# Patient Record
Sex: Female | Born: 1955 | Race: White | Hispanic: No | Marital: Married | Smoking: Former smoker
Health system: Southern US, Community
[De-identification: ages and names within clinical notes are randomized; demographics above are authoritative.]

## PROBLEM LIST (undated history)

## (undated) DIAGNOSIS — Z973 Presence of spectacles and contact lenses: Secondary | ICD-10-CM

## (undated) DIAGNOSIS — R011 Cardiac murmur, unspecified: Secondary | ICD-10-CM

## (undated) DIAGNOSIS — N6019 Diffuse cystic mastopathy of unspecified breast: Secondary | ICD-10-CM

## (undated) DIAGNOSIS — L900 Lichen sclerosus et atrophicus: Secondary | ICD-10-CM

## (undated) DIAGNOSIS — Z803 Family history of malignant neoplasm of breast: Secondary | ICD-10-CM

## (undated) DIAGNOSIS — I10 Essential (primary) hypertension: Secondary | ICD-10-CM

## (undated) HISTORY — PX: BREAST EXCISIONAL BIOPSY: SUR124

## (undated) HISTORY — DX: Family history of malignant neoplasm of breast: Z80.3

## (undated) HISTORY — DX: Diffuse cystic mastopathy of unspecified breast: N60.19

## (undated) HISTORY — PX: BREAST BIOPSY: SHX20

## (undated) HISTORY — PX: ABDOMINAL HYSTERECTOMY: SHX81

## (undated) HISTORY — DX: Essential (primary) hypertension: I10

## (undated) HISTORY — PX: BREAST CYST ASPIRATION: SHX578

---

## 2014-08-28 ENCOUNTER — Other Ambulatory Visit: Payer: Self-pay | Admitting: Obstetrics & Gynecology

## 2014-08-28 DIAGNOSIS — N6012 Diffuse cystic mastopathy of left breast: Secondary | ICD-10-CM

## 2014-08-28 DIAGNOSIS — Z803 Family history of malignant neoplasm of breast: Secondary | ICD-10-CM

## 2014-08-28 DIAGNOSIS — N6011 Diffuse cystic mastopathy of right breast: Secondary | ICD-10-CM

## 2014-09-04 ENCOUNTER — Ambulatory Visit
Admission: RE | Admit: 2014-09-04 | Discharge: 2014-09-04 | Disposition: A | Discharge status: Home / Self Care | Payer: 59 | Source: Ambulatory Visit | Attending: Obstetrics & Gynecology | Admitting: Obstetrics & Gynecology

## 2014-09-04 ENCOUNTER — Ambulatory Visit: Payer: Self-pay

## 2014-09-04 DIAGNOSIS — N63 Unspecified lump in breast: Secondary | ICD-10-CM | POA: Insufficient documentation

## 2014-09-04 DIAGNOSIS — N6011 Diffuse cystic mastopathy of right breast: Secondary | ICD-10-CM

## 2014-09-04 DIAGNOSIS — N6012 Diffuse cystic mastopathy of left breast: Secondary | ICD-10-CM

## 2014-09-04 DIAGNOSIS — Z803 Family history of malignant neoplasm of breast: Secondary | ICD-10-CM

## 2014-11-25 ENCOUNTER — Ambulatory Visit (INDEPENDENT_AMBULATORY_CARE_PROVIDER_SITE_OTHER): Payer: 59 | Admitting: Family Medicine

## 2014-11-25 ENCOUNTER — Encounter: Payer: Self-pay | Admitting: Family Medicine

## 2014-11-25 VITALS — BP 140/88 | HR 76 | Ht 66.0 in | Wt 165.2 lb

## 2014-11-25 DIAGNOSIS — I10 Essential (primary) hypertension: Secondary | ICD-10-CM | POA: Diagnosis not present

## 2014-11-25 DIAGNOSIS — E559 Vitamin D deficiency, unspecified: Secondary | ICD-10-CM | POA: Diagnosis not present

## 2014-11-25 DIAGNOSIS — S39012A Strain of muscle, fascia and tendon of lower back, initial encounter: Secondary | ICD-10-CM

## 2014-11-25 MED ORDER — CYCLOBENZAPRINE HCL 10 MG PO TABS: 10.0000 mg | ORAL_TABLET | Freq: Every evening | ORAL | Status: DC | PRN

## 2014-11-26 ENCOUNTER — Encounter: Payer: Self-pay | Admitting: Family Medicine

## 2014-11-26 DIAGNOSIS — I10 Essential (primary) hypertension: Secondary | ICD-10-CM | POA: Insufficient documentation

## 2014-11-26 NOTE — Progress Notes (Signed)
Date:  11/25/2014   Name:  Ebony Harris   DOB:  Jun 24, 1955   MRN:  [id number]  PCP:  Schuyler Amor, MD    Chief Complaint: Back Pain   History of Present Illness:  This is a 59 y.o. female who injured back 9 days ago skiing, felt pop R lower back while trying to get up on one ski, pain has slowly improved but still causing trouble sleeping, no significant radiation up back or down leg, no numbness/weakness distally, pain better with walking. Taking BP med for HTN, lipids ok 07/2013, has stopped vit D supplement as getting adequate sun exposure.  Review of Systems:  Review of Systems  Patient Active Problem List   Diagnosis Date Noted  . Hypertension 11/26/2014    Prior to Admission medications   Medication Sig Start Date End Date Taking? Authorizing Provider  lisinopril-hydrochlorothiazide (PRINZIDE,ZESTORETIC) 20-12.5 MG per tablet Take 1 tablet by mouth daily at 2 PM daily at 2 PM. 09/17/14  Yes Historical Provider, MD  Multiple Vitamins-Minerals (MULTIVITAMIN WITH MINERALS) tablet Take 1 tablet by mouth daily.   Yes Historical Provider, MD  cyclobenzaprine (FLEXERIL) 10 MG tablet Take 1 tablet (10 mg total) by mouth at bedtime as needed for muscle spasms. 11/25/14   Schuyler Amor, MD    No Known Allergies  Past Surgical History  Procedure Laterality Date  . Breast biopsy Left     03/2014 negative  . Breast biopsy Left     1992 negative  . Breast cyst aspiration Left     negative  . Abdominal hysterectomy      partial    History  Substance Use Topics  . Smoking status: Never Smoker   . Smokeless tobacco: Not on file  . Alcohol Use: 0.0 oz/week    0 Standard drinks or equivalent per week    Family History  Problem Relation Age of Onset  . Breast cancer Daughter 86    Medication list has been reviewed and updated.  Physical Examination: BP 140/88 mmHg  Pulse 76  Ht  (1.676 m)  Wt 165 lb 3.2 oz (74.934 kg)  BMI 26.68 kg/m2  Physical Exam  Constitutional:  She appears well-developed and well-nourished. No distress.  Musculoskeletal:  Mildly tender R paralumbar region, negative B SLR     Assessment and Plan:  1. Lumbar strain, initial encounter Begin Flexeril qhs prn pain, may continue Aleve bid, call if sxs worsen/persist  2. Vitamin D deficiency Ok to stop supplementation with adequate sun exposure  3. Essential hypertension Adequate control, continue current regimen  Return in about 6 months (around 05/28/2015).  Dionne Ano. Kingsley Spittle MD [facility]  11/26/2014

## 2015-01-03 ENCOUNTER — Ambulatory Visit (INDEPENDENT_AMBULATORY_CARE_PROVIDER_SITE_OTHER): Payer: PRIVATE HEALTH INSURANCE | Admitting: Family Medicine

## 2015-01-03 ENCOUNTER — Encounter: Payer: Self-pay | Admitting: Family Medicine

## 2015-01-03 VITALS — BP 130/72 | HR 76 | Ht 66.0 in | Wt 162.4 lb

## 2015-01-03 DIAGNOSIS — S39012S Strain of muscle, fascia and tendon of lower back, sequela: Secondary | ICD-10-CM

## 2015-01-03 DIAGNOSIS — I10 Essential (primary) hypertension: Secondary | ICD-10-CM

## 2015-01-03 NOTE — Progress Notes (Signed)
Date:  01/03/2015   Name:  Ebony Harris   DOB:  07/06/1955   MRN:  [id number]  PCP:  Schuyler Amor, MD    Chief Complaint: Back Pain   History of Present Illness:  This is a 59 y.o. female s/p acute lumbar strain 6 wks ago, still having intermittent back pain/muscle spasms especially at night. Interested in exercises to help back. Has not been taking Flexeril.  Review of Systems:  Review of Systems  Musculoskeletal: Negative for gait problem.  Neurological: Negative for weakness and numbness.    Patient Active Problem List   Diagnosis Date Noted  . Hypertension 11/26/2014    Prior to Admission medications   Medication Sig Start Date End Date Taking? Authorizing Provider  cyclobenzaprine (FLEXERIL) 10 MG tablet Take 1 tablet (10 mg total) by mouth at bedtime as needed for muscle spasms. 11/25/14  Yes Schuyler Amor, MD  fluticasone (FLONASE) 50 MCG/ACT nasal spray Place into both nostrils daily.   Yes Historical Provider, MD  lisinopril-hydrochlorothiazide (PRINZIDE,ZESTORETIC) 20-12.5 MG per tablet Take 1 tablet by mouth daily at 2 PM daily at 2 PM. 09/17/14  Yes Historical Provider, MD  Multiple Vitamins-Minerals (MULTIVITAMIN WITH MINERALS) tablet Take 1 tablet by mouth daily.   Yes Historical Provider, MD    No Known Allergies  Past Surgical History  Procedure Laterality Date  . Breast biopsy Left     03/2014 negative  . Breast biopsy Left     1992 negative  . Breast cyst aspiration Left     negative  . Abdominal hysterectomy      partial    Social History  Substance Use Topics  . Smoking status: Never Smoker   . Smokeless tobacco: None  . Alcohol Use: 0.0 oz/week    0 Standard drinks or equivalent per week    Family History  Problem Relation Age of Onset  . Breast cancer Daughter 72    Medication list has been reviewed and updated.  Physical Examination: BP 130/72 mmHg  Pulse 76  Ht  (1.676 m)  Wt 162 lb 6.4 oz (73.664 kg)  BMI 26.22  kg/m2  Physical Exam  Constitutional: She appears well-developed and well-nourished.  Musculoskeletal: She exhibits no edema.  Negative B SLR No spinal or paralumbar tenderness    Assessment and Plan:  1. Lumbar strain, sequela Restart Flexeril qhs for two weeks - Ambulatory referral to Physical Therapy  2. HTN Well controlled on current regimen   Return if symptoms worsen or fail to improve.  Dionne Ano. Kingsley Spittle MD [facility]  01/03/2015

## 2015-01-08 ENCOUNTER — Encounter: Payer: Self-pay | Admitting: Physical Therapy

## 2015-01-08 ENCOUNTER — Ambulatory Visit: Payer: 59 | Attending: Family Medicine | Admitting: Physical Therapy

## 2015-01-08 DIAGNOSIS — M6289 Other specified disorders of muscle: Secondary | ICD-10-CM

## 2015-01-08 DIAGNOSIS — G729 Myopathy, unspecified: Secondary | ICD-10-CM | POA: Insufficient documentation

## 2015-01-08 DIAGNOSIS — M545 Low back pain, unspecified: Secondary | ICD-10-CM

## 2015-01-08 NOTE — Therapy (Signed)
[place] [facility] [address] [place], [place], [address] Phone: [phone]   Fax:  [phone]  Physical Therapy Evaluation  Patient Details  Name: Terianna Peggs MRN: [id number] Date of Birth: [birth year] Referring Provider:  Schuyler Amor, MD  Encounter Date: 01/08/2015      PT End of Session - 01/08/15 1234    Visit Number 1   Number of Visits 6   Date for PT Re-Evaluation 01/29/15   PT Start Time 0728   PT Stop Time 0819   PT Time Calculation (min) 51 min   Activity Tolerance Patient tolerated treatment well;No increased pain   Behavior During Therapy [facility] for tasks assessed/performed      History reviewed. No pertinent past medical history.  Past Surgical History  Procedure Laterality Date  . Breast biopsy Left     03/2014 negative  . Breast biopsy Left     1992 negative  . Breast cyst aspiration Left     negative  . Abdominal hysterectomy      partial    There were no vitals filed for this visit.  Visit Diagnosis:  Midline low back pain without sciatica  Muscle tightness      Subjective Assessment - 01/08/15 1227    Subjective Pt reports no back pain just stiffness and muscle spasms at night. Pt states she injuried her back 8 weeks ago while water skiing and felt a pop immediately after. Pt reports trying medication, ice, heat and stretching with minimal relief. Pt reports relief from PRN Flexeril.    Limitations Sitting;Standing;Other (comment)  transition from sitting to standing is worse   How [place] can you sit comfortably? 20 mins    How [place] can you stand comfortably? 20 mins    How [place] can you walk comfortably? 3 miles    Patient Stated Goals decrease spasm/return to walking without stiffness/transitioning from sit to stand to make work tasks easier.   Currently in Pain? No/denies       OBJECTIVE: Manual: STM to paraspinals in prone position with Biofreeze, focusing on lumbar musculature (effleurage)-  15 min.       PT Education - 01/08/15 1232    Education provided Yes   Education Details Pt given a handout with supine piriformis, knees to chest and hamstring stretching. Pt educated on performing these at night. Pt given prone press ups and standing extension to help with paraspinal tightness. Pt instructed on icing and how to use tennis ball for self-massage.   Person(s) Educated Patient   Methods Explanation;Demonstration;Verbal cues;Handout   Comprehension Verbalized understanding;Returned demonstration             PT [place] Term Goals - 01/08/15 1242    PT Davis TERM GOAL #1   Title Pt will be independent with HEP to report a decrease in muscle spasms to less than 2 a night.    Time 3   Period Weeks   Status New   PT G[place] TERM GOAL #2   Title Pt will decrease paraspinal fascial tightness in order to transition sit to stand without compliants of low back pain.    Baseline stiffness noted with transition from sit to stand    Time 3   Period Weeks   Status New   PT W[place] TERM GOAL #3   Title Pt will report no tenderness with paraspinal STM to lumbar spine in order to increase functional mobility.    Time 3   Period Weeks  Status New               Plan - 01/08/15 1234    Clinical Impression Statement Pt is a 59 y.o pleasant F referred for low back spasms and tightness. Pt denies pain and states that most of her spasms occur in the evening. Pt reports walking 3 miles daily and that this has not affected her walking.  Pt lumbar motion is WFL. Pt MMT is 5/5. Tenderness noted with palpation to paraspinal musculature in L3-L5 region. Stiffness noted in L3-L5.  Pt will benefit from skilled PT to address her soft tissue restrictions and stiffness in her low back.    Pt will benefit from skilled therapeutic intervention in order to improve on the following deficits Decreased activity tolerance;Decreased mobility;Hypomobility;Increased fascial restricitons;Improper body  mechanics;Impaired flexibility   Rehab Potential Excellent   PT Frequency 2x / week   PT Duration 3 weeks   PT Treatment/Interventions ADLs/Self Care Home Management;Cryotherapy;Moist Heat;Neuromuscular re-education;Patient/family education;Stair training;Gait training;Functional mobility training;Therapeutic activities;Therapeutic exercise;Manual techniques;Dry needling;Passive range of motion   PT Next Visit Plan continue soft tissue work add in core stability exercises for prevention    PT Home Exercise Plan stretching, icing and soft tissue massage    Recommended Other Services continue walking program    Consulted and Agree with Plan of Care Patient         Problem List Patient Active Problem List   Diagnosis Date Noted  . Hypertension 11/26/2014   Cammie Mcgee, PT, DPT # [phone]   01/09/2015, 9:25 AM  [place] [facility] [address] [place], [place], [address] Phone: [phone]   Fax:  [phone]

## 2015-01-09 ENCOUNTER — Encounter: Payer: Self-pay | Admitting: Physical Therapy

## 2015-01-13 ENCOUNTER — Ambulatory Visit: Payer: 59 | Admitting: Physical Therapy

## 2015-01-13 DIAGNOSIS — M545 Low back pain, unspecified: Secondary | ICD-10-CM

## 2015-01-13 DIAGNOSIS — M6289 Other specified disorders of muscle: Secondary | ICD-10-CM

## 2015-01-13 NOTE — Therapy (Signed)
[place] [facility] [address] [place], [place], [address] Phone: [phone]   Fax:  [phone]  Physical Therapy Treatment  Patient Details  Name: Ebony Harris MRN: [id number] Date of Birth: 1956/03/02 Referring Provider:  Schuyler Amor, MD  Encounter Date: 01/13/2015      PT End of Session - 01/13/15 1005    Visit Number 2   Number of Visits 6   Date for PT Re-Evaluation 01/29/15   PT Start Time 0729   PT Stop Time 0802   PT Time Calculation (min) 33 min   Activity Tolerance Patient tolerated treatment well;No increased pain   Behavior During Therapy [facility] for tasks assessed/performed      No past medical history on file.  Past Surgical History  Procedure Laterality Date  . Breast biopsy Left     03/2014 negative  . Breast biopsy Left     1992 negative  . Breast cyst aspiration Left     negative  . Abdominal hysterectomy      partial    There were no vitals filed for this visit.  Visit Diagnosis:  Midline low back pain without sciatica  Muscle tightness      Subjective Assessment - 01/13/15 1001    Subjective Pt reports stiffness over the weekend at night. Pt reports only one night of muscle spasming. Pt reports no pain and no problem completing her ADLs.    Limitations Sitting;Standing;Other (comment)  transition from sitting to standing is worse   How [place] can you sit comfortably? 20 mins    How [place] can you stand comfortably? 20 mins    How [place] can you walk comfortably? 3 miles    Patient Stated Goals decrease spasm/return to walking without stiffness/transitioning from sit to stand to make work tasks easier.   Currently in Pain? No/denies      OBJECTIVE: Manual: STM to lumbar paraspinals (trigger points noted at L3 and L5). Generalized lumbar soft tissue massage. Central PAs grade II/III Lumbar spine (hypomobility noted L3-L5). B LE stretching (good flexibility noted). Core stability started with good TrA  contraction (tactile cues given).   Pt response to Tx for medical necessity: No increased complaints of pain. Stiffness noted with Central PAs, hypomobility in the lumbar spine.          PT Education - 01/13/15 1013    Education provided Yes   Education Details given core stabilty packet. goot TrA contraction but pt reports feeling how weak her core is with that.    Person(s) Educated Patient   Methods Explanation;Demonstration;Handout;Tactile cues   Comprehension Verbalized understanding;Returned demonstration             PT [place] Term Goals - 01/08/15 1242    PT [place] TERM GOAL #1   Title Pt will be independent with HEP to report a decrease in muscle spasms to less than 2 a night.    Time 3   Period Weeks   Status New   PT Durrell TERM GOAL #2   Title Pt will decrease paraspinal fascial tightness in order to transition sit to stand without compliants of low back pain.    Baseline stiffness noted with transition from sit to stand    Time 3   Period Weeks   Status New   PT Sher TERM GOAL #3   Title Pt will report no tenderness with paraspinal STM to lumbar spine in order to increase functional mobility.    Time 3  Period Weeks   Status New             Plan - 01/13/15 1009    Clinical Impression Statement Pt maintaining flexibility well. Pt presents with hypomobility of lumbar spine (increased L3-L5) noted with grade II/III central PAs. R palable trigger points noted at L3 and L5 in paraspinals. Pt found relief with generalized STM as well as point specific ischemic compression. Initiated core stability with pt today to help strengthen as a preventive measure.  Good TrA contraction noted with page 1 of core stabilty.    Pt will benefit from skilled therapeutic intervention in order to improve on the following deficits Decreased activity tolerance;Decreased mobility;Hypomobility;Increased fascial restricitons;Improper body mechanics;Impaired flexibility   Rehab Potential  Excellent   PT Frequency 2x / week   PT Duration 3 weeks   PT Treatment/Interventions ADLs/Self Care Home Management;Cryotherapy;Moist Heat;Neuromuscular re-education;Patient/family education;Stair training;Gait training;Functional mobility training;Therapeutic activities;Therapeutic exercise;Manual techniques;Dry needling;Passive range of motion   PT Next Visit Plan continue soft tissue work add in core stability exercises for prevention    PT Home Exercise Plan stretching, icing and soft tissue massage    Consulted and Agree with Plan of Care Patient        Problem List Patient Active Problem List   Diagnosis Date Noted  . Hypertension 11/26/2014   Cammie Mcgee, PT, DPT # [id number]   01/14/2015, 10:16 AM  [place] [facility] [address] [place], [place], [address] Phone: [phone]   Fax:  [phone]

## 2015-01-15 ENCOUNTER — Ambulatory Visit: Payer: 59 | Admitting: Physical Therapy

## 2015-01-20 ENCOUNTER — Ambulatory Visit: Payer: 59 | Attending: Family Medicine | Admitting: Physical Therapy

## 2015-01-20 DIAGNOSIS — M545 Low back pain, unspecified: Secondary | ICD-10-CM

## 2015-01-20 DIAGNOSIS — G729 Myopathy, unspecified: Secondary | ICD-10-CM | POA: Diagnosis present

## 2015-01-20 DIAGNOSIS — M6289 Other specified disorders of muscle: Secondary | ICD-10-CM

## 2015-01-20 NOTE — Therapy (Signed)
[facility] [address] [place], [place], [address] Phone: [phone]   Fax:  [phone]  Physical Therapy Treatment  Patient Details  Name: Ebony Harris MRN: [id number] Date of Birth: 1955-10-20 Referring Provider:  Adline Potter, MD  Encounter Date: 01/20/2015      PT End of Session - 01/20/15 1408    Visit Number 3   Number of Visits 6   Date for PT Re-Evaluation 01/29/15   PT Start Time 0720   PT Stop Time 0819   PT Time Calculation (min) 59 min   Activity Tolerance Patient tolerated treatment well;No increased pain   Behavior During Therapy [facility] for tasks assessed/performed      No past medical history on file.  Past Surgical History  Procedure Laterality Date  . Breast biopsy Left     03/2014 negative  . Breast biopsy Left     1992 negative  . Breast cyst aspiration Left     negative  . Abdominal hysterectomy      partial    There were no vitals filed for this visit.  Visit Diagnosis:  Midline low back pain without sciatica  Muscle tightness      Subjective Assessment - 01/20/15 1011    Subjective Pt reports stiffness over the weekend at night. Pt reports only one night of muscle spasming. Pt reports no pain and no problem completing her ADLs.    Limitations Sitting;Standing;Other (comment)  transition from sitting to standing is worse   How [place] can you sit comfortably? 20 mins    How [place] can you stand comfortably? 20 mins    How [place] can you walk comfortably? 3 miles    Patient Stated Goals decrease spasm/return to walking without stiffness/transitioning from sit to stand to make work tasks easier.   Currently in Pain? No/denies         OBJECTIVE: Warmup: heat in prone position with 7 layers between pt and moist heat pack (no charge). Manual: Central PAs to lumbar spine grade III 4 x 30 seconds each level. STM to B paraspinals (R stiffness> L stiffness) in prone. R paraspinal ischemic compression  and effeularge to R paraspinals in prone. Biofreeze applied to lumbar region.   Pt response to Tx for medical necessity: Decreased report of muscle spasms or problems with transitional exercises. Pt benefits from manual STM to increase mobility and decrease R paraspinal stiffness.           PT [place] Term Goals - 01/20/15 1413    PT [place] TERM GOAL #1   Title Pt will be independent with HEP to report a decrease in muscle spasms to less than 2 a night.    Time 3   Period Weeks   Status Partially Met   PT K[place] TERM GOAL #2   Title Pt will decrease paraspinal fascial tightness in order to transition sit to stand without compliants of low back pain.    Baseline stiffness noted with transition from sit to stand    Time 3   Period Weeks   Status On-going   PT Brian TERM GOAL #3   Title Pt will report no tenderness with paraspinal STM to lumbar spine in order to increase functional mobility.    Time 3   Period Weeks   Status Partially Met               Plan - 01/20/15 1409    Clinical Impression Statement Good spine mobility noted  with central PAs at grade III. Stiffness in lumbar paraspinals (R>L) with relief from ischemic compression and STM. Pt reports relief with STM to R paraspinals. Heat used to loosen up pt's back prior to tx, pt reports relief with it. At end of session, stiffness in R paraspinal 50% reduced feeling with palpation. Pt reports improvement in mobility and feeling of stiffness post STM.    Pt will benefit from skilled therapeutic intervention in order to improve on the following deficits Decreased activity tolerance;Decreased mobility;Hypomobility;Increased fascial restricitons;Improper body mechanics;Impaired flexibility   Rehab Potential Excellent   PT Frequency 2x / week   PT Duration 3 weeks   PT Treatment/Interventions ADLs/Self Care Home Management;Cryotherapy;Moist Heat;Neuromuscular re-education;Patient/family education;Stair training;Gait  training;Functional mobility training;Therapeutic activities;Therapeutic exercise;Manual techniques;Dry needling;Passive range of motion   PT Next Visit Plan soft tissue release on R lumbar paraspinals   PT Home Exercise Plan stretching, icing and soft tissue massage/walking    Consulted and Agree with Plan of Care Patient        Problem List Patient Active Problem List   Diagnosis Date Noted  . Hypertension 11/26/2014   Pura Spice, PT, DPT # [phone]   01/21/2015, 9:57 AM  [place] [facility] [address] [place], [place], [address] Phone: [phone]   Fax:  [phone]

## 2015-01-22 ENCOUNTER — Ambulatory Visit: Payer: 59 | Admitting: Physical Therapy

## 2015-01-22 ENCOUNTER — Encounter: Payer: Self-pay | Admitting: Physical Therapy

## 2015-01-22 DIAGNOSIS — M545 Low back pain, unspecified: Secondary | ICD-10-CM

## 2015-01-22 DIAGNOSIS — M6289 Other specified disorders of muscle: Secondary | ICD-10-CM

## 2015-01-22 NOTE — Therapy (Signed)
[facility] [address] [place], [place], [address] Phone: [phone]   Fax:  [phone]  Physical Therapy Treatment  Patient Details  Name: Ebony Harris MRN: [id number] Date of Birth: 1955/09/04 Referring Provider:  Schuyler Amor, MD  Encounter Date: 01/22/2015      PT End of Session - 01/22/15 1627    Visit Number 4   Number of Visits 6   Date for PT Re-Evaluation 01/29/15   PT Start Time 0715   PT Stop Time 0756   PT Time Calculation (min) 41 min   Activity Tolerance Patient tolerated treatment well;No increased pain   Behavior During Therapy [facility] for tasks assessed/performed      History reviewed. No pertinent past medical history.  Past Surgical History  Procedure Laterality Date  . Breast biopsy Left     03/2014 negative  . Breast biopsy Left     1992 negative  . Breast cyst aspiration Left     negative  . Abdominal hysterectomy      partial    There were no vitals filed for this visit.  Visit Diagnosis:  Midline low back pain without sciatica  Muscle tightness      Subjective Assessment - 01/22/15 1626    Subjective Pt reports good relief from last PT tx session. Pt reports no pain or stiffness in her low back. Pt reports she will be moving over the weekend.   Limitations Sitting;Standing;Other (comment)  transition from sitting to standing is worse   How [place] can you sit comfortably? 20 mins    How Stanger can you stand comfortably? 20 mins    How [place] can you walk comfortably? 3 miles    Patient Stated Goals decrease spasm/return to walking without stiffness/transitioning from sit to stand to make work tasks easier.   Currently in Pain? No/denies        OBJECTIVE: Manual: STM to paraspinals with effleurage (no increased tenderness noted). Central and unilateral PAs to lumbar spine grade III 20 seconds x 3, good mobility noted. There ex: Seated green therapy ball (65 cm): pelvic clocks/straight leg  raise/marching/alternating arm and leg 10 x 2 each.   Pt response to Tx for medical necessity: No pain reported with any movement. Pt reported therapy ball feeling good for mobility. No tenderness noted with STM. Pt maintaining gains from last session to increase functional pain free mobility.         PT Education - 01/22/15 1633    Education provided Yes   Education Details Pt given ball core stability program.    Person(s) Educated Patient   Methods Explanation;Demonstration;Handout   Comprehension Returned demonstration;Verbalized understanding             PT [place] Term Goals - 01/22/15 1633    PT Mcgrady TERM GOAL #1   Title Pt will be independent with HEP to report a decrease in muscle spasms to less than 2 a night.    Time 3   Period Weeks   Status Achieved   PT Luby TERM GOAL #2   Title Pt will decrease paraspinal fascial tightness in order to transition sit to stand without compliants of low back pain.    Baseline stiffness noted with transition from sit to stand    Time 3   Period Weeks   Status Achieved   PT Vande[place] TERM GOAL #3   Title Pt will report no tenderness with paraspinal STM to lumbar spine in order to increase  functional mobility.    Time 3   Period Weeks   Status Achieved   PT Gardin TERM GOAL #4   Title Pt will be independent with core stability on therapy ball in order to transition to independent exercise program.   Time 2   Period Weeks   Status New   PT Sherk TERM GOAL #5   Title Pt will be independent with supine TrA contraction in order to safely lift for her job demands.    Time 2   Period Weeks   Status New            Plan - 01/22/15 1630    Clinical Impression Statement No palpable tenderness noted with soft tissue massage to lumbar paraspinals. Good spine mobility with central and rotational PAs. Core strengthening progressing to consistent TrA contraction and exercises on the green therapy ball. Good balance on therapy ball without UE  support with dynamic LE movements. Good flexibility noted with stretching.    Pt will benefit from skilled therapeutic intervention in order to improve on the following deficits Decreased activity tolerance;Decreased mobility;Hypomobility;Increased fascial restricitons;Improper body mechanics;Impaired flexibility   Rehab Potential Excellent   PT Frequency 2x / week   PT Duration 3 weeks   PT Treatment/Interventions ADLs/Self Care Home Management;Cryotherapy;Moist Heat;Neuromuscular re-education;Patient/family education;Stair training;Gait training;Functional mobility training;Therapeutic activities;Therapeutic exercise;Manual techniques;Dry needling;Passive range of motion   PT Next Visit Plan reassess goals/possible discharge/ Reassess core stability and ball exercises   PT Home Exercise Plan ball core program   Consulted and Agree with Plan of Care Patient        Problem List Patient Active Problem List   Diagnosis Date Noted  . Hypertension 11/26/2014   Cammie Mcgee, PT, DPT # [id number]   01/23/2015, 8:25 AM  [facility] [address] [place], [place], [address] Phone: [phone]   Fax:  [phone]

## 2015-01-27 ENCOUNTER — Ambulatory Visit: Payer: 59 | Admitting: Physical Therapy

## 2015-01-29 ENCOUNTER — Ambulatory Visit: Payer: 59 | Admitting: Physical Therapy

## 2015-03-18 ENCOUNTER — Other Ambulatory Visit: Payer: Self-pay | Admitting: Obstetrics & Gynecology

## 2015-03-18 DIAGNOSIS — N6310 Unspecified lump in the right breast, unspecified quadrant: Secondary | ICD-10-CM

## 2015-04-02 ENCOUNTER — Ambulatory Visit (INDEPENDENT_AMBULATORY_CARE_PROVIDER_SITE_OTHER): Payer: PRIVATE HEALTH INSURANCE

## 2015-04-02 DIAGNOSIS — Z23 Encounter for immunization: Secondary | ICD-10-CM

## 2015-04-07 ENCOUNTER — Ambulatory Visit
Admission: RE | Admit: 2015-04-07 | Discharge: 2015-04-07 | Disposition: A | Discharge status: Home / Self Care | Payer: 59 | Source: Ambulatory Visit | Attending: Obstetrics & Gynecology | Admitting: Obstetrics & Gynecology

## 2015-04-07 DIAGNOSIS — N6310 Unspecified lump in the right breast, unspecified quadrant: Secondary | ICD-10-CM

## 2015-04-07 DIAGNOSIS — N63 Unspecified lump in breast: Secondary | ICD-10-CM | POA: Diagnosis present

## 2015-08-20 LAB — HM PAP SMEAR

## 2015-09-19 ENCOUNTER — Telehealth: Payer: Self-pay

## 2015-09-19 NOTE — Telephone Encounter (Signed)
Patient called on [place] to [place] and requested Valtrex be called in. She said she had cold sore on chin like 10 years ago and then they said to take as soon as feeling a burn. I explained Dr.Plonk was off and she asked if a different MD could call in. I advised her that she can use OTC abreva or be seen in UC due to the fact that this is no where in her chart as a problem and no Rx listed as well as she has not been seen since 11/2014. She asked me to call the MD from 10 years ago and I again advised UC or OTC Abreva.

## 2015-09-24 ENCOUNTER — Encounter: Payer: Self-pay | Admitting: Family Medicine

## 2015-09-24 ENCOUNTER — Ambulatory Visit (INDEPENDENT_AMBULATORY_CARE_PROVIDER_SITE_OTHER): Payer: PRIVATE HEALTH INSURANCE | Admitting: Family Medicine

## 2015-09-24 VITALS — BP 126/82 | HR 67 | Temp 98.4°F | Resp 16 | Ht 66.0 in | Wt 161.0 lb

## 2015-09-24 DIAGNOSIS — J309 Allergic rhinitis, unspecified: Secondary | ICD-10-CM

## 2015-09-24 DIAGNOSIS — H01006 Unspecified blepharitis left eye, unspecified eyelid: Secondary | ICD-10-CM | POA: Diagnosis not present

## 2015-09-24 DIAGNOSIS — I10 Essential (primary) hypertension: Secondary | ICD-10-CM

## 2015-09-24 DIAGNOSIS — B009 Herpesviral infection, unspecified: Secondary | ICD-10-CM | POA: Diagnosis not present

## 2015-09-24 MED ORDER — HYDROCORTISONE 1 % EX CREA: 1.0000 "application " | TOPICAL_CREAM | Freq: Two times a day (BID) | CUTANEOUS | Status: DC | PRN

## 2015-09-24 MED ORDER — FAMCICLOVIR 500 MG PO TABS: 1500.0000 mg | ORAL_TABLET | Freq: Once | ORAL | Status: DC

## 2015-09-24 MED ORDER — LISINOPRIL-HYDROCHLOROTHIAZIDE 20-12.5 MG PO TABS: 1.0000 | ORAL_TABLET | Freq: Every day | ORAL | Status: DC

## 2015-09-26 DIAGNOSIS — J309 Allergic rhinitis, unspecified: Secondary | ICD-10-CM | POA: Insufficient documentation

## 2015-09-26 NOTE — Progress Notes (Signed)
Date:  09/24/2015   Name:  Ebony Harris   DOB:  Nov 08, 1955   MRN:  [id number]  PCP:  Schuyler AmorWilliam Liyla Radliff, MD    Chief Complaint: Herpes Zoster   History of Present Illness:  This is a 60 y.o. female seen in 759 month f/u. Taking Prinzide but not Flonase as allergies not bothering now. Has had outbreak of HSV on chin since last week, minimal improvement with Abreva. Also has rash L eyelid unrelated to chin.   Review of Systems:  Review of Systems  Constitutional: Negative for fever and fatigue.  Respiratory: Negative for cough and shortness of breath.   Cardiovascular: Negative for chest pain and leg swelling.  Neurological: Negative for syncope and light-headedness.    Patient Active Problem List   Diagnosis Date Noted  . Allergic rhinitis 09/26/2015  . Herpes simplex 09/24/2015  . Hypertension 11/26/2014    Prior to Admission medications   Medication Sig Start Date End Date Taking? Authorizing Provider  lisinopril-hydrochlorothiazide (PRINZIDE,ZESTORETIC) 20-12.5 MG tablet Take 1 tablet by mouth daily. 09/24/15  Yes Schuyler AmorWilliam Gerrianne Aydelott, MD  Multiple Vitamins-Minerals (MULTIVITAMIN WITH MINERALS) tablet Take 1 tablet by mouth daily.   Yes Historical Provider, MD  famciclovir (FAMVIR) 500 MG tablet Take 3 tablets (1,500 mg total) by mouth once. 09/24/15   Schuyler AmorWilliam Solana Coggin, MD  hydrocortisone cream 1 % Apply 1 application topically 2 (two) times daily as needed for itching. 09/24/15   Schuyler AmorWilliam Fedra Lanter, MD    No Known Allergies  Past Surgical History  Procedure Laterality Date  . Breast biopsy Left     03/2014 negative  . Breast biopsy Left     1992 negative  . Breast cyst aspiration Left     negative  . Abdominal hysterectomy      partial    Social History  Substance Use Topics  . Smoking status: Never Smoker   . Smokeless tobacco: None  . Alcohol Use: 0.0 oz/week    0 Standard drinks or equivalent per week    Family History  Problem Relation Age of Onset  . Breast cancer Daughter 2029     Medication list has been reviewed and updated.  Physical Examination: BP 126/82 mmHg  Pulse 67  Temp(Src) 98.4 F (36.9 C) (Oral)  Resp 16  Ht 5\' 6"  (1.676 m)  Wt 161 lb (73.029 kg)  BMI 26.00 kg/m2  SpO2 97%  Physical Exam  Constitutional: She appears well-developed and well-nourished.  Eyes:  Erythema, scaling L upper eyelid  Cardiovascular: Normal rate, regular rhythm and normal heart sounds.   Pulmonary/Chest: Effort normal and breath sounds normal.  Musculoskeletal: She exhibits no edema.  Neurological: She is alert.  Skin: Skin is warm and dry.  Vesicular rash over chin  Psychiatric: She has a normal mood and affect. Her behavior is normal.  Nursing note and vitals reviewed.   Assessment and Plan:  1. Herpes simplex Famvir 1500 mg x 1 dose with 1 refill  2. Essential hypertension Well controlled - lisinopril-hydrochlorothiazide (PRINZIDE,ZESTORETIC) 20-12.5 MG tablet; Take 1 tablet by mouth daily.  Dispense: 90 tablet; Refill: 3  3. Blepharitis of eyelid of left eye HC cream 1% bid, call if sxs worsen/persist  4. Allergic rhinitis, unspecified allergic rhinitis type Well controlled off Flonase  5. HM Consider zoster imm, blood work next visit  Return in about 6 months (around 03/25/2016).  Dionne AnoWilliam M. Kingsley SpittlePlonk, Jr. MD [facility]  09/26/2015

## 2015-11-14 ENCOUNTER — Other Ambulatory Visit: Payer: Self-pay

## 2015-11-14 DIAGNOSIS — I10 Essential (primary) hypertension: Secondary | ICD-10-CM

## 2015-11-14 MED ORDER — LISINOPRIL-HYDROCHLOROTHIAZIDE 20-12.5 MG PO TABS: 1.0000 | ORAL_TABLET | Freq: Every day | ORAL | 3 refills | Status: DC

## 2015-12-04 ENCOUNTER — Ambulatory Visit (INDEPENDENT_AMBULATORY_CARE_PROVIDER_SITE_OTHER): Payer: PRIVATE HEALTH INSURANCE | Admitting: Family Medicine

## 2015-12-04 ENCOUNTER — Encounter: Payer: Self-pay | Admitting: Family Medicine

## 2015-12-04 ENCOUNTER — Other Ambulatory Visit: Payer: Self-pay | Admitting: Obstetrics & Gynecology

## 2015-12-04 VITALS — BP 130/82 | HR 80 | Ht 66.0 in | Wt 164.0 lb

## 2015-12-04 DIAGNOSIS — R928 Other abnormal and inconclusive findings on diagnostic imaging of breast: Secondary | ICD-10-CM

## 2015-12-04 DIAGNOSIS — N63 Unspecified lump in unspecified breast: Secondary | ICD-10-CM

## 2015-12-04 DIAGNOSIS — Z1211 Encounter for screening for malignant neoplasm of colon: Secondary | ICD-10-CM

## 2015-12-04 DIAGNOSIS — I10 Essential (primary) hypertension: Secondary | ICD-10-CM

## 2015-12-04 DIAGNOSIS — E785 Hyperlipidemia, unspecified: Secondary | ICD-10-CM

## 2015-12-04 DIAGNOSIS — R079 Chest pain, unspecified: Secondary | ICD-10-CM

## 2015-12-04 MED ORDER — LISINOPRIL-HYDROCHLOROTHIAZIDE 20-12.5 MG PO TABS: 1.0000 | ORAL_TABLET | Freq: Every day | ORAL | 1 refills | Status: DC

## 2015-12-04 NOTE — Progress Notes (Signed)
Name: Ebony Harris   MRN: [id number]    DOB: 1955/11/24   Date:12/04/2015       Progress Note  Subjective  Chief Complaint  Chief Complaint  Patient presents with  . Follow-up    wants renal and lipid drawn due to not having one in over a year    Hypertension  This is a chronic problem. The current episode started more than 1 year ago. The problem has been gradually improving since onset. The problem is controlled. Pertinent negatives include no anxiety, blurred vision, chest pain, headaches, malaise/fatigue, neck pain, orthopnea, palpitations, peripheral edema, PND, shortness of breath or sweats. Risk factors for coronary artery disease include obesity. Past treatments include ACE inhibitors and diuretics. The current treatment provides mild improvement. There are no compliance problems.  There is no history of angina, kidney disease, CAD/MI, CVA, heart failure, left ventricular hypertrophy, PVD, renovascular disease or retinopathy. There is no history of chronic renal disease or a hypertension causing med.  Chest Pain   This is a new problem. The current episode started more than 1 year ago. The onset quality is gradual. The problem occurs intermittently. The problem has been waxing and waning. Pain location: upper substernal. The pain is at a severity of 4/10. The pain is mild. The quality of the pain is described as tightness. The pain does not radiate. Associated symptoms include exertional chest pressure. Pertinent negatives include no abdominal pain, back pain, cough, dizziness, fever, headaches, malaise/fatigue, nausea, orthopnea, palpitations, PND, shortness of breath or sputum production.  Her past medical history is significant for hypertension.  Pertinent negatives for past medical history include no PVD.  Her family medical history is significant for heart disease.    No problem-specific Assessment & Plan notes found for this encounter.   Past Medical History:  Diagnosis Date  .  Hypertension   . Skin disease    lichen sclerosis    Past Surgical History:  Procedure Laterality Date  . ABDOMINAL HYSTERECTOMY     partial  . BREAST BIOPSY Left    03/2014 negative  . BREAST BIOPSY Left    1992 negative  . BREAST CYST ASPIRATION Left    negative    Family History  Problem Relation Age of Onset  . Breast cancer Daughter 2229    Social History   Social History  . Marital status: Unknown    Spouse name: N/A  . Number of children: N/A  . Years of education: N/A   Occupational History  . Not on file.   Social History Main Topics  . Smoking status: Never Smoker  . Smokeless tobacco: Not on file  . Alcohol use 0.0 oz/week  . Drug use: No  . Sexual activity: Not on file   Other Topics Concern  . Not on file   Social History Narrative  . No narrative on file    No Known Allergies   Review of Systems  Constitutional: Negative for chills, fever, malaise/fatigue and weight loss.  HENT: Negative for ear discharge, ear pain and sore throat.   Eyes: Negative for blurred vision.  Respiratory: Negative for cough, sputum production, shortness of breath and wheezing.   Cardiovascular: Negative for chest pain, palpitations, orthopnea, leg swelling and PND.  Gastrointestinal: Negative for abdominal pain, blood in stool, constipation, diarrhea, heartburn, melena and nausea.  Genitourinary: Negative for dysuria, frequency, hematuria and urgency.  Musculoskeletal: Negative for back pain, joint pain, myalgias and neck pain.  Skin: Negative for rash.  Neurological: Negative  for dizziness, tingling, sensory change, focal weakness and headaches.  Endo/Heme/Allergies: Negative for environmental allergies and polydipsia. Does not bruise/bleed easily.  Psychiatric/Behavioral: Negative for depression and suicidal ideas. The patient is not nervous/anxious and does not have insomnia.      Objective  Vitals:   12/04/15 0855  BP: 130/82  Pulse: 80  Weight: 164 lb  (74.4 kg)  Height: 5\' 6"  (1.676 m)    Physical Exam  Constitutional: She is well-developed, well-nourished, and in no distress. No distress.  HENT:  Head: Normocephalic and atraumatic.  Right Ear: External ear normal.  Left Ear: External ear normal.  Nose: Nose normal.  Mouth/Throat: Oropharynx is clear and moist.  Eyes: Conjunctivae and EOM are normal. Pupils are equal, round, and reactive to light. Right eye exhibits no discharge. Left eye exhibits no discharge.  Neck: Normal range of motion. Neck supple. No JVD present. No thyromegaly present.  Cardiovascular: Normal rate, regular rhythm, normal heart sounds and intact distal pulses.  Exam reveals no gallop and no friction rub.   No murmur heard. Pulmonary/Chest: Effort normal and breath sounds normal. She has no wheezes. She has no rales. She exhibits no tenderness.  Abdominal: Soft. Bowel sounds are normal. She exhibits no mass. There is no tenderness. There is no guarding.  Genitourinary: Rectal exam shows guaiac negative stool. No vaginal discharge found.  Musculoskeletal: Normal range of motion. She exhibits no edema.  Lymphadenopathy:    She has no cervical adenopathy.  Neurological: She is alert. She has normal reflexes.  Skin: Skin is warm and dry. She is not diaphoretic.  Psychiatric: Mood and affect normal.  Nursing note and vitals reviewed.     Assessment & Plan  Problem List Items Addressed This Visit      Cardiovascular and Mediastinum   Hypertension - Primary   Relevant Medications   lisinopril-hydrochlorothiazide (PRINZIDE,ZESTORETIC) 20-12.5 MG tablet   Other Relevant Orders   Renal Function Panel    Other Visit Diagnoses    Hyperlipidemia       Relevant Medications   lisinopril-hydrochlorothiazide (PRINZIDE,ZESTORETIC) 20-12.5 MG tablet   Other Relevant Orders   Lipid Profile   Chest pain, exertional       Relevant Orders   EKG 12-Lead (Completed)   Ambulatory referral to Cardiology   Colon  cancer screening       Relevant Orders   Ambulatory referral to Gastroenterology   Abnormal screening mammogram       follow up mammogram        Dr. Hayden Rasmusseneanna Jones [facility]  12/04/15

## 2015-12-05 LAB — RENAL FUNCTION PANEL
ALBUMIN: 4.3 g/dL (ref 3.6–4.8)
BUN/Creatinine Ratio: 20 (ref 12–28)
BUN: 19 mg/dL (ref 8–27)
CALCIUM: 9 mg/dL (ref 8.7–10.3)
CO2: 25 mmol/L (ref 18–29)
Chloride: 101 mmol/L (ref 96–106)
Creatinine, Ser: 0.95 mg/dL (ref 0.57–1.00)
GFR calc Af Amer: 75 mL/min/{1.73_m2} (ref >59)
GFR calc non Af Amer: 65 mL/min/{1.73_m2} (ref >59)
GLUCOSE: 89 mg/dL (ref 65–99)
PHOSPHORUS: 3.9 mg/dL (ref 2.5–4.5)
POTASSIUM: 4 mmol/L (ref 3.5–5.2)
SODIUM: 145 mmol/L — AB (ref 134–144)

## 2015-12-05 LAB — LIPID PANEL
CHOLESTEROL TOTAL: 272 mg/dL — AB (ref 100–199)
Chol/HDL Ratio: 4.6 ratio units — ABNORMAL HIGH (ref 0.0–4.4)
HDL: 59 mg/dL (ref >39)
LDL Calculated: 184 mg/dL — ABNORMAL HIGH (ref 0–99)
TRIGLYCERIDES: 147 mg/dL (ref 0–149)
VLDL Cholesterol Cal: 29 mg/dL (ref 5–40)

## 2015-12-12 ENCOUNTER — Telehealth: Payer: Self-pay

## 2015-12-12 ENCOUNTER — Other Ambulatory Visit: Payer: Self-pay

## 2015-12-12 NOTE — Telephone Encounter (Signed)
Gastroenterology Pre-Procedure Review  Request Date: 02/09/2016 Requesting Physician: Dr. Hollace HaywardPlonk  PATIENT REVIEW QUESTIONS: The patient responded to the following health history questions as indicated:    1. Are you having any GI issues? no 2. Do you have a personal history of Polyps? no 3. Do you have a family history of Colon Cancer or Polyps? no 4. Diabetes Mellitus? no 5. Joint replacements in the past 12 months?no 6. Major health problems in the past 3 months?no 7. Any artificial heart valves, MVP, or defibrillator?no    MEDICATIONS & ALLERGIES:    Patient reports the following regarding taking any anticoagulation/antiplatelet therapy:   Plavix, Coumadin, Eliquis, Xarelto, Lovenox, Pradaxa, Brilinta, or Effient? no Aspirin? yes (heart health)  Patient confirms/reports the following medications:  Current Outpatient Prescriptions  Medication Sig Dispense Refill  . aspirin EC 81 MG tablet Take 81 mg by mouth daily.    . Clobetasol Propionate 0.05 % lotion Apply topically 2 (two) times daily. Dr Tiburcio PeaHarris    . famciclovir (FAMVIR) 500 MG tablet Take 3 tablets (1,500 mg total) by mouth once. 6 tablet 0  . lisinopril-hydrochlorothiazide (PRINZIDE,ZESTORETIC) 20-12.5 MG tablet Take 1 tablet by mouth daily. 90 tablet 1  . Multiple Vitamins-Minerals (MULTIVITAMIN WITH MINERALS) tablet Take 1 tablet by mouth daily.     No current facility-administered medications for this visit.     Patient confirms/reports the following allergies:  No Known Allergies  No orders of the defined types were placed in this encounter.   AUTHORIZATION INFORMATION Primary Insurance: 1D#: Group #:  Secondary Insurance: 1D#: Group #:  SCHEDULE INFORMATION: Date: 02/09/2016 Time: Location: [facility]

## 2015-12-12 NOTE — Telephone Encounter (Signed)
[facility] 02/09/2016 Please pre cert

## 2015-12-30 ENCOUNTER — Ambulatory Visit
Admission: RE | Admit: 2015-12-30 | Discharge: 2015-12-30 | Disposition: A | Discharge status: Home / Self Care | Payer: 59 | Source: Ambulatory Visit | Attending: Obstetrics & Gynecology | Admitting: Obstetrics & Gynecology

## 2015-12-30 DIAGNOSIS — R921 Mammographic calcification found on diagnostic imaging of breast: Secondary | ICD-10-CM | POA: Insufficient documentation

## 2015-12-30 DIAGNOSIS — N6011 Diffuse cystic mastopathy of right breast: Secondary | ICD-10-CM | POA: Insufficient documentation

## 2015-12-30 DIAGNOSIS — N63 Unspecified lump in unspecified breast: Secondary | ICD-10-CM

## 2016-01-28 ENCOUNTER — Ambulatory Visit (INDEPENDENT_AMBULATORY_CARE_PROVIDER_SITE_OTHER): Payer: PRIVATE HEALTH INSURANCE

## 2016-01-28 DIAGNOSIS — Z23 Encounter for immunization: Secondary | ICD-10-CM | POA: Diagnosis not present

## 2016-02-03 ENCOUNTER — Encounter: Payer: Self-pay | Admitting: *Deleted

## 2016-02-05 NOTE — Discharge Instructions (Signed)

## 2016-02-09 ENCOUNTER — Ambulatory Visit
Admission: RE | Admit: 2016-02-09 | Discharge: 2016-02-09 | Disposition: A | Discharge status: Home / Self Care | Payer: 59 | Source: Ambulatory Visit | Attending: Gastroenterology | Admitting: Gastroenterology

## 2016-02-09 ENCOUNTER — Encounter
Admission: RE | Disposition: A | Discharge status: Home / Self Care | Payer: Self-pay | Source: Ambulatory Visit | Attending: Gastroenterology

## 2016-02-09 ENCOUNTER — Ambulatory Visit: Payer: 59 | Admitting: Anesthesiology

## 2016-02-09 DIAGNOSIS — Z87891 Personal history of nicotine dependence: Secondary | ICD-10-CM | POA: Diagnosis not present

## 2016-02-09 DIAGNOSIS — K641 Second degree hemorrhoids: Secondary | ICD-10-CM | POA: Diagnosis not present

## 2016-02-09 DIAGNOSIS — Z1211 Encounter for screening for malignant neoplasm of colon: Secondary | ICD-10-CM

## 2016-02-09 DIAGNOSIS — K621 Rectal polyp: Secondary | ICD-10-CM | POA: Diagnosis not present

## 2016-02-09 DIAGNOSIS — Z79899 Other long term (current) drug therapy: Secondary | ICD-10-CM | POA: Diagnosis not present

## 2016-02-09 DIAGNOSIS — I10 Essential (primary) hypertension: Secondary | ICD-10-CM | POA: Insufficient documentation

## 2016-02-09 DIAGNOSIS — Z7982 Long term (current) use of aspirin: Secondary | ICD-10-CM | POA: Diagnosis not present

## 2016-02-09 DIAGNOSIS — K5730 Diverticulosis of large intestine without perforation or abscess without bleeding: Secondary | ICD-10-CM | POA: Insufficient documentation

## 2016-02-09 HISTORY — DX: Lichen sclerosus et atrophicus: L90.0

## 2016-02-09 HISTORY — PX: COLONOSCOPY WITH PROPOFOL: SHX5780

## 2016-02-09 HISTORY — PX: POLYPECTOMY: SHX5525

## 2016-02-09 HISTORY — DX: Presence of spectacles and contact lenses: Z97.3

## 2016-02-09 HISTORY — DX: Cardiac murmur, unspecified: R01.1

## 2016-02-09 SURGERY — COLONOSCOPY WITH PROPOFOL
Anesthesia: Monitor Anesthesia Care | Wound class: Contaminated

## 2016-02-09 MED ORDER — LACTATED RINGERS IV SOLN
INTRAVENOUS | Status: DC
  Administered 2016-02-09: 07:00:00 via INTRAVENOUS

## 2016-02-09 MED ORDER — PROPOFOL 10 MG/ML IV BOLUS
INTRAVENOUS | Status: DC | PRN
  Administered 2016-02-09: 20 mg via INTRAVENOUS
  Administered 2016-02-09: 10 mg via INTRAVENOUS
  Administered 2016-02-09: 20 mg via INTRAVENOUS
  Administered 2016-02-09: 10 mg via INTRAVENOUS
  Administered 2016-02-09: 20 mg via INTRAVENOUS
  Administered 2016-02-09: 50 mg via INTRAVENOUS
  Administered 2016-02-09 (×2): 20 mg via INTRAVENOUS
  Administered 2016-02-09: 10 mg via INTRAVENOUS
  Administered 2016-02-09 (×2): 20 mg via INTRAVENOUS

## 2016-02-09 MED ORDER — STERILE WATER FOR IRRIGATION IR SOLN
Status: DC | PRN
  Administered 2016-02-09: 08:00:00

## 2016-02-09 MED ORDER — OXYCODONE HCL 5 MG PO TABS: 5.0000 mg | ORAL_TABLET | Freq: Once | ORAL | Status: DC | PRN

## 2016-02-09 MED ORDER — LIDOCAINE HCL (CARDIAC) 20 MG/ML IV SOLN
INTRAVENOUS | Status: DC | PRN
  Administered 2016-02-09: 50 mg via INTRAVENOUS

## 2016-02-09 MED ORDER — OXYCODONE HCL 5 MG/5ML PO SOLN: 5.0000 mg | Freq: Once | ORAL | Status: DC | PRN

## 2016-02-09 SURGICAL SUPPLY — 23 items

## 2016-02-09 NOTE — Transfer of Care (Signed)
Immediate Anesthesia Transfer of Care Note  Patient: Ebony Harris  Procedure(s) Performed: Procedure(s): COLONOSCOPY WITH PROPOFOL (N/A) POLYPECTOMY  Patient Location: PACU  Anesthesia Type: MAC  Level of Consciousness: awake, alert  and patient cooperative  Airway and Oxygen Therapy: Patient Spontanous Breathing and Patient connected to supplemental oxygen  Post-op Assessment: Post-op Vital signs reviewed, Patient's Cardiovascular Status Stable, Respiratory Function Stable, Patent Airway and No signs of Nausea or vomiting  Post-op Vital Signs: Reviewed and stable  Complications: No apparent anesthesia complications

## 2016-02-09 NOTE — Anesthesia Procedure Notes (Signed)
Procedure Name: MAC Performed by: Rhonna Holster Pre-anesthesia Checklist: Patient identified, Emergency Drugs available, Suction available, Timeout performed and Patient being monitored Patient Re-evaluated:Patient Re-evaluated prior to inductionOxygen Delivery Method: Nasal cannula Placement Confirmation: positive ETCO2       

## 2016-02-09 NOTE — Anesthesia Postprocedure Evaluation (Signed)
Anesthesia Post Note  Patient: Ebony Harris  Procedure(s) Performed: Procedure(s) (LRB): COLONOSCOPY WITH PROPOFOL (N/A) POLYPECTOMY  Patient location during evaluation: PACU Anesthesia Type: MAC Level of consciousness: awake and awake and alert Pain management: pain level controlled Vital Signs Assessment: post-procedure vital signs reviewed and stable Respiratory status: spontaneous breathing Cardiovascular status: blood pressure returned to baseline Postop Assessment: no headache Anesthetic complications: no    Verner Cholunkle, III,  Cheridan Kibler D

## 2016-02-09 NOTE — Op Note (Signed)
[facility] Gastroenterology Patient Name: Ebony Harris Procedure Date: 02/09/2016 7:50 AM MRN: [id number] Account #: [id number] Date of Birth: 11/14/1955 Admit Type: Outpatient Age: 60 Room: [facility] OR ROOM 01 Gender: Female Note Status: Finalized Procedure:            Colonoscopy Indications:          Screening for colorectal malignant neoplasm Providers:            Midge Minium MD, MD Referring MD:         Dionne Ano. Plonk, MD (Referring MD) Medicines:            Propofol per Anesthesia Complications:        No immediate complications. Procedure:            Pre-Anesthesia Assessment:                       - Prior to the procedure, a History and Physical was                        performed, and patient medications and allergies were                        reviewed. The patient's tolerance of previous                        anesthesia was also reviewed. The risks and benefits of                        the procedure and the sedation options and risks were                        discussed with the patient. All questions were                        answered, and informed consent was obtained. Prior                        Anticoagulants: The patient has taken no previous                        anticoagulant or antiplatelet agents. ASA Grade                        Assessment: II - A patient with mild systemic disease.                        After reviewing the risks and benefits, the patient was                        deemed in satisfactory condition to undergo the                        procedure.                       After obtaining informed consent, the colonoscope was                        passed under direct vision. Throughout the procedure,  the patient's blood pressure, pulse, and oxygen                        saturations were monitored continuously. The was                        introduced through the anus and advanced to the the                  cecum, identified by appendiceal orifice and ileocecal                        valve. The colonoscopy was performed without                        difficulty. The patient tolerated the procedure well.                        The quality of the bowel preparation was excellent. Findings:      The perianal and digital rectal examinations were normal.      A 3 mm polyp was found in the rectum. The polyp was sessile. The polyp       was removed with a cold biopsy forceps. Resection and retrieval were       complete.      Internal hemorrhoids were found during retroflexion. The hemorrhoids       were Grade II (internal hemorrhoids that prolapse but reduce       spontaneously).      Multiple small-mouthed diverticula were found in the sigmoid colon. Impression:           - One 3 mm polyp in the rectum, removed with a cold                        biopsy forceps. Resected and retrieved.                       - Internal hemorrhoids.                       - Diverticulosis in the sigmoid colon. Recommendation:       - Discharge patient to home.                       - Resume previous diet.                       - Repeat colonoscopy in 5 years if polyp adenoma and 10                        years if hyperplastic Procedure Code(s):    --- Professional ---                       [id number], Colonoscopy, flexible; with biopsy, single or                        multiple Diagnosis Code(s):    --- Professional ---                       Z12.11, Encounter for screening for malignant neoplasm  of colon                       K62.1, Rectal polyp CPT copyright 2016 [employer]. All rights reserved. The codes documented in this report are preliminary and upon coder review may  be revised to meet current compliance requirements. Midge Minium MD, MD 02/09/2016 8:11:01 AM This report has been signed electronically. Number of Addenda: 0 Note Initiated On: 02/09/2016  7:50 AM Scope Withdrawal Time: 0 hours 8 minutes 53 seconds  Total Procedure Duration: 0 hours 12 minutes 13 seconds       [facility]

## 2016-02-09 NOTE — Anesthesia Preprocedure Evaluation (Addendum)
Anesthesia Evaluation  Patient identified by MRN, date of birth, ID band Patient awake    Reviewed: Allergy & Precautions, H&P , NPO status , Patient's Chart, lab work & pertinent test results  History of Anesthesia Complications Negative for: history of anesthetic complications  Airway Mallampati: II  TM Distance: >3 FB Neck ROM: full    Dental   Pulmonary former smoker,    Pulmonary exam normal        Cardiovascular hypertension, On Medications Normal cardiovascular exam     Neuro/Psych    GI/Hepatic negative GI ROS, Neg liver ROS,   Endo/Other  negative endocrine ROS  Renal/GU negative Renal ROS     Musculoskeletal   Abdominal   Peds  Hematology negative hematology ROS (+)   Anesthesia Other Findings   Reproductive/Obstetrics                             Anesthesia Physical Anesthesia Plan  ASA: II  Anesthesia Plan: MAC   Post-op Pain Management:    Induction:   Airway Management Planned:   Additional Equipment:   Intra-op Plan:   Post-operative Plan:   Informed Consent: I have reviewed the patients History and Physical, chart, labs and discussed the procedure including the risks, benefits and alternatives for the proposed anesthesia with the patient or authorized representative who has indicated his/her understanding and acceptance.     Plan Discussed with:   Anesthesia Plan Comments:         Anesthesia Quick Evaluation

## 2016-02-09 NOTE — H&P (Signed)
  Midge Miniumarren Kandi Brusseau, MD [facility] [address] [place], [place] [address] Phone: [phone] Fax : [phone]  Primary Care Physician:  Schuyler AmorWilliam Plonk, MD Primary Gastroenterologist:  Dr. Servando SnareWohl  Pre-Procedure History & Physical: HPI:  Ebony Harris is a 60 y.o. female is here for a screening colonoscopy.   Past Medical History:  Diagnosis Date  . Heart murmur   . Hypertension   . Lichen sclerosus   . Wears contact lenses     Past Surgical History:  Procedure Laterality Date  . ABDOMINAL HYSTERECTOMY     partial  . BREAST BIOPSY Left    03/2014 negative  . BREAST BIOPSY Left    1992 negative  . BREAST CYST ASPIRATION Left    negative    Prior to Admission medications   Medication Sig Start Date End Date Taking? Authorizing Provider  aspirin EC 81 MG tablet Take 81 mg by mouth daily.   Yes Historical Provider, MD  Clobetasol Propionate 0.05 % lotion Apply topically 2 (two) times daily. Dr Tiburcio PeaHarris   Yes Historical Provider, MD  lisinopril-hydrochlorothiazide (PRINZIDE,ZESTORETIC) 20-12.5 MG tablet Take 1 tablet by mouth daily. 12/04/15  Yes Duanne Limerickeanna C Jones, MD  Multiple Vitamins-Minerals (MULTIVITAMIN WITH MINERALS) tablet Take 1 tablet by mouth daily.   Yes Historical Provider, MD  famciclovir (FAMVIR) 500 MG tablet Take 3 tablets (1,500 mg total) by mouth once. 09/24/15   Schuyler AmorWilliam Plonk, MD    Allergies as of 12/12/2015  . (No Known Allergies)    Family History  Problem Relation Age of Onset  . Breast cancer Daughter 1229    Social History   Social History  . Marital status: Married    Spouse name: N/A  . Number of children: N/A  . Years of education: N/A   Occupational History  . Not on file.   Social History Main Topics  . Smoking status: Former Smoker    Quit date: 04/19/1980  . Smokeless tobacco: Never Used  . Alcohol use 8.4 oz/week    14 Glasses of wine per week  . Drug use: No  . Sexual activity: Not on file   Other Topics Concern  . Not on file    Social History Narrative  . No narrative on file    Review of Systems: See HPI, otherwise negative ROS  Physical Exam: BP 133/87   Pulse 67   Temp 97.5 F (36.4 C) (Temporal)   Resp 16   Ht 5\' 6"  (1.676 m)   Wt 159 lb (72.1 kg)   SpO2 98%   BMI 25.66 kg/m  General:   Alert,  pleasant and cooperative in NAD Head:  Normocephalic and atraumatic. Neck:  Supple; no masses or thyromegaly. Lungs:  Clear throughout to auscultation.    Heart:  Regular rate and rhythm. Abdomen:  Soft, nontender and nondistended. Normal bowel sounds, without guarding, and without rebound.   Neurologic:  Alert and  oriented x4;  grossly normal neurologically.  Impression/Plan: Ebony Harris is now here to undergo a screening colonoscopy.  Risks, benefits, and alternatives regarding colonoscopy have been reviewed with the patient.  Questions have been answered.  All parties agreeable.

## 2016-02-10 ENCOUNTER — Encounter: Payer: Self-pay | Admitting: Gastroenterology

## 2016-02-12 ENCOUNTER — Encounter: Payer: Self-pay | Admitting: Gastroenterology

## 2016-03-07 ENCOUNTER — Ambulatory Visit
Admission: EM | Admit: 2016-03-07 | Discharge: 2016-03-07 | Disposition: A | Discharge status: Home / Self Care | Payer: 59 | Attending: Family Medicine | Admitting: Family Medicine

## 2016-03-07 ENCOUNTER — Encounter: Payer: Self-pay | Admitting: Gynecology

## 2016-03-07 DIAGNOSIS — J329 Chronic sinusitis, unspecified: Secondary | ICD-10-CM

## 2016-03-07 DIAGNOSIS — J029 Acute pharyngitis, unspecified: Secondary | ICD-10-CM

## 2016-03-07 LAB — RAPID STREP SCREEN (MED CTR MEBANE ONLY): STREPTOCOCCUS, GROUP A SCREEN (DIRECT): NEGATIVE

## 2016-03-07 MED ORDER — FLUTICASONE PROPIONATE 50 MCG/ACT NA SUSP: 2.0000 | Freq: Every day | NASAL | 0 refills | Status: DC

## 2016-03-07 MED ORDER — HYDROCOD POLST-CPM POLST ER 10-8 MG/5ML PO SUER: 5.0000 mL | Freq: Two times a day (BID) | ORAL | 0 refills | Status: DC | PRN

## 2016-03-07 MED ORDER — AMOXICILLIN-POT CLAVULANATE 875-125 MG PO TABS: 1.0000 | ORAL_TABLET | Freq: Two times a day (BID) | ORAL | 0 refills | Status: DC

## 2016-03-07 NOTE — ED Triage Notes (Signed)
Patient c/o sinus pain x 1 week.

## 2016-03-07 NOTE — ED Provider Notes (Signed)
[facility]    CSN: [id number] Arrival date & time: 03/07/16  0807     History   Chief Complaint Chief Complaint  Patient presents with  . Facial Pain    HPI Ebony Harris is a 60 y.o. female.   Patient is a 60 year old white female states she's had a sore throat for a week. States that she does have sinus drainage and coughing through the night unable sleep. She maintains her throat is very sore and is more than just sinus drainage. She has difficulty swallowing and feels pressure in her face. Past smoking history she has history of hypertension heart murmur. She's had abdominal hysterectomy and breast biopsy and colonoscopy for the past. She is a former smoker no known drug allergies. She reports daughter has breast cancer   The history is provided by the patient. No language interpreter was used.  Sore Throat  This is a new problem. The current episode started more than 2 days ago. The problem occurs constantly. The problem has not changed since onset.Pertinent negatives include no chest pain, no abdominal pain, no headaches and no shortness of breath. Nothing aggravates the symptoms. Nothing relieves the symptoms. Treatments tried: ibuprophen. The treatment provided no relief.    Past Medical History:  Diagnosis Date  . Heart murmur   . Hypertension   . Lichen sclerosus   . Wears contact lenses     Patient Active Problem List   Diagnosis Date Noted  . Special screening for malignant neoplasms, colon   . Rectal polyp   . Allergic rhinitis 09/26/2015  . Herpes simplex 09/24/2015  . Hypertension 11/26/2014    Past Surgical History:  Procedure Laterality Date  . ABDOMINAL HYSTERECTOMY     partial  . BREAST BIOPSY Left    03/2014 negative  . BREAST BIOPSY Left    1992 negative  . BREAST CYST ASPIRATION Left    negative  . COLONOSCOPY WITH PROPOFOL N/A 02/09/2016   Procedure: COLONOSCOPY WITH PROPOFOL;  Surgeon: Midge Minium, MD;  [facility];  Service: Endoscopy;  Laterality: N/A;  . POLYPECTOMY  02/09/2016   Procedure: POLYPECTOMY;  Surgeon: Midge Minium, MD;  Location: [facility];  Service: Endoscopy;;    OB History    No data available       Home Medications    Prior to Admission medications   Medication Sig Start Date End Date Taking? Authorizing Provider  aspirin EC 81 MG tablet Take 81 mg by mouth daily.   Yes Historical Provider, MD  Clobetasol Propionate 0.05 % lotion Apply topically 2 (two) times daily. Dr Tiburcio Pea   Yes Historical Provider, MD  lisinopril-hydrochlorothiazide (PRINZIDE,ZESTORETIC) 20-12.5 MG tablet Take 1 tablet by mouth daily. 12/04/15  Yes Duanne Limerick, MD  Multiple Vitamins-Minerals (MULTIVITAMIN WITH MINERALS) tablet Take 1 tablet by mouth daily.   Yes Historical Provider, MD  amoxicillin-clavulanate (AUGMENTIN) 875-125 MG tablet Take 1 tablet by mouth 2 (two) times daily. 03/07/16   Hassan Rowan, MD  chlorpheniramine-HYDROcodone [facility] PENNKINETIC ER) 10-8 MG/5ML SUER Take 5 mLs by mouth every 12 (twelve) hours as needed for cough. 03/07/16   Hassan Rowan, MD  famciclovir (FAMVIR) 500 MG tablet Take 3 tablets (1,500 mg total) by mouth once. 09/24/15   Schuyler Amor, MD  fluticasone (FLONASE) 50 MCG/ACT nasal spray Place 2 sprays into both nostrils daily. 03/07/16   Hassan Rowan, MD    Family History Family History  Problem Relation Age of Onset  . Breast cancer  Daughter [id number]    Social History Social History  Substance Use Topics  . Smoking status: Former Smoker    Quit date: 04/19/1980  . Smokeless tobacco: Never Used  . Alcohol use 8.4 oz/week    14 Glasses of wine per week     Allergies   Patient has no known allergies.   Review of Systems Review of Systems  HENT: Positive for congestion, rhinorrhea, sinus pain, sinus pressure, sore throat and trouble swallowing.   Eyes: Negative for visual disturbance.  Respiratory: Positive for cough. Negative for shortness of  breath.   Cardiovascular: Negative for chest pain.  Gastrointestinal: Negative for abdominal pain.  Neurological: Negative for headaches.  All other systems reviewed and are negative.    Physical Exam Triage Vital Signs ED Triage Vitals  Enc Vitals Group     BP 03/07/16 0902 (!) 167/90     Pulse Rate 03/07/16 0902 66     Resp 03/07/16 0902 16     Temp 03/07/16 0902 (!) 96.5 F (35.8 C)     Temp Source 03/07/16 0902 Tympanic     SpO2 03/07/16 0902 100 %     Weight 03/07/16 0904 163 lb (73.9 kg)     Height 03/07/16 0904 5\' 6"  (1.676 m)     Head Circumference --      Peak Flow --      Pain Score 03/07/16 0905 3     Pain Loc --      Pain Edu? --      Excl. in GC? --    No data found.   Updated Vital Signs BP (!) 167/90 (BP Location: Left Arm)   Pulse 66   Temp (!) 96.5 F (35.8 C) (Tympanic)   Resp 16   Ht 5\' 6"  (1.676 m)   Wt 163 lb (73.9 kg)   SpO2 100%   BMI 26.31 kg/m   Visual Acuity Right Eye Distance:   Left Eye Distance:   Bilateral Distance:    Right Eye Near:   Left Eye Near:    Bilateral Near:     Physical Exam  Constitutional: She is oriented to person, place, and time. She appears well-developed and well-nourished.  HENT:  Head: Normocephalic and atraumatic.  Right Ear: External ear normal.  Left Ear: External ear normal.  Eyes: EOM are normal. Pupils are equal, round, and reactive to light.  Neck: Normal range of motion. Neck supple. No tracheal deviation present. No thyromegaly present.  Pulmonary/Chest: Effort normal.  Musculoskeletal: Normal range of motion. She exhibits no edema or deformity.  Lymphadenopathy:    She has cervical adenopathy.  Neurological: She is alert and oriented to person, place, and time. No cranial nerve deficit.  Skin: Skin is warm and dry.  Psychiatric: She has a normal mood and affect.  Vitals reviewed.    UC Treatments / Results  Labs (all labs ordered are listed, but only abnormal results are  displayed) Labs Reviewed  RAPID STREP SCREEN (NOT AT [facility])  CULTURE, GROUP A STREP [facility])    EKG  EKG Interpretation None       Radiology No results found.  Procedures Procedures (including critical care time)  Medications Ordered in UC Medications - No data to display   Initial Impression / Assessment and Plan / UC Course  I have reviewed the triage vital signs and the nursing notes.  Pertinent labs & imaging results that were available during my care of the patient were reviewed by me and  considered in my medical decision making (see chart for details).   Results for orders placed or performed during the hospital encounter of 03/07/16  Rapid strep screen  Result Value Ref Range   Streptococcus, Group A Screen (Direct) NEGATIVE NEGATIVE   Clinical Course     Patient reports that is always sore now since illness started because of the persistent irritation and pain in the throat attempt made to do strep test was patient could not or refuse to cooperate fully. Swab with current specimen will be sent to the lab quality of the specimen felt to be poor but no further attempts were made to swab her throat by physician  Final Clinical Impressions(s) / UC Diagnoses   Final diagnoses:  Acute pharyngitis, unspecified etiology  Sinusitis, unspecified chronicity, unspecified location    New Prescriptions New Prescriptions   AMOXICILLIN-CLAVULANATE (AUGMENTIN) 875-125 MG TABLET    Take 1 tablet by mouth 2 (two) times daily.   CHLORPHENIRAMINE-HYDROCODONE (TUSSIONEX PENNKINETIC ER) 10-8 MG/5ML SUER    Take 5 mLs by mouth every 12 (twelve) hours as needed for cough.   FLUTICASONE (FLONASE) 50 MCG/ACT NASAL SPRAY    Place 2 sprays into both nostrils daily.   Non specific sinus infection unable to get good evaluation of her throat. We'll place Augmentin 875 she's had intolerance to Sudafed before so we'll place her on Flonase nasal spray and test next the cough. Dr. Yetta BarreJones if not  better.    Note: This dictation was prepared with Dragon dictation along with smaller phrase technology. Any transcriptional errors that result from this process are unintentional.   Hassan RowanEugene Xander Jutras, MD 03/07/16 1013

## 2016-03-09 ENCOUNTER — Other Ambulatory Visit: Payer: Self-pay

## 2016-03-09 MED ORDER — AZITHROMYCIN 250 MG PO TABS: ORAL_TABLET | ORAL | 0 refills | Status: DC

## 2016-03-10 ENCOUNTER — Telehealth: Payer: Self-pay | Admitting: *Deleted

## 2016-03-10 LAB — CULTURE, GROUP A STREP (THRC)

## 2016-03-10 NOTE — Telephone Encounter (Signed)
Called patient, no answer, left message on answering machine reporting a negative strep culture result. Advised patient to follow up with PCP or [facility] if symptoms persist. 

## 2016-03-24 ENCOUNTER — Ambulatory Visit (INDEPENDENT_AMBULATORY_CARE_PROVIDER_SITE_OTHER): Payer: PRIVATE HEALTH INSURANCE | Admitting: Family Medicine

## 2016-03-24 VITALS — BP 138/68 | HR 74 | Temp 98.7°F | Ht 66.0 in | Wt 167.0 lb

## 2016-03-24 DIAGNOSIS — I10 Essential (primary) hypertension: Secondary | ICD-10-CM

## 2016-03-24 DIAGNOSIS — Z23 Encounter for immunization: Secondary | ICD-10-CM | POA: Diagnosis not present

## 2016-03-24 MED ORDER — LISINOPRIL-HYDROCHLOROTHIAZIDE 20-12.5 MG PO TABS: 1.0000 | ORAL_TABLET | Freq: Every day | ORAL | 3 refills | Status: DC

## 2016-03-24 NOTE — Progress Notes (Signed)
Name: Ebony Harris   MRN: [id number]    DOB: 01/30/1956   Date:03/24/2016       Progress Note  Subjective  Chief Complaint  Chief Complaint  Patient presents with  . Follow-up    6 month follow up    Hypertension  This is a chronic problem. The current episode started more than 1 year ago. The problem has been gradually improving since onset. The problem is controlled. Pertinent negatives include no anxiety, blurred vision, chest pain, headaches, malaise/fatigue, neck pain, orthopnea, palpitations, peripheral edema, PND, shortness of breath or sweats. There are no associated agents to hypertension. There are no known risk factors for coronary artery disease. Past treatments include ACE inhibitors and diuretics. The current treatment provides moderate improvement. There are no compliance problems.  There is no history of kidney disease, CAD/MI, CVA, heart failure, left ventricular hypertrophy, PVD, renovascular disease or retinopathy. There is no history of chronic renal disease or a hypertension causing med.    No problem-specific Assessment & Plan notes found for this encounter.   Past Medical History:  Diagnosis Date  . Heart murmur   . Hypertension   . Lichen sclerosus   . Wears contact lenses     Past Surgical History:  Procedure Laterality Date  . ABDOMINAL HYSTERECTOMY     partial  . BREAST BIOPSY Left    03/2014 negative  . BREAST BIOPSY Left    1992 negative  . BREAST CYST ASPIRATION Left    negative  . COLONOSCOPY WITH PROPOFOL N/A 02/09/2016   Procedure: COLONOSCOPY WITH PROPOFOL;  Surgeon: Midge Miniumarren Wohl, MD;  [facility];  Service: Endoscopy;  Laterality: N/A;  . POLYPECTOMY  02/09/2016   Procedure: POLYPECTOMY;  Surgeon: Midge Miniumarren Wohl, MD;  [facility];  Service: Endoscopy;;    Family History  Problem Relation Age of Onset  . Breast cancer Daughter 529    Social History   Social History  . Marital status: Married    Spouse  name: N/A  . Number of children: N/A  . Years of education: N/A   Occupational History  . Not on file.   Social History Main Topics  . Smoking status: Former Smoker    Quit date: 04/19/1980  . Smokeless tobacco: Never Used  . Alcohol use 8.4 oz/week    14 Glasses of wine per week  . Drug use: No  . Sexual activity: Not on file   Other Topics Concern  . Not on file   Social History Narrative  . No narrative on file    No Known Allergies   Review of Systems  Constitutional: Negative for chills, fever, malaise/fatigue and weight loss.  HENT: Negative for ear discharge, ear pain and sore throat.   Eyes: Negative for blurred vision.  Respiratory: Negative for cough, sputum production, shortness of breath and wheezing.   Cardiovascular: Negative for chest pain, palpitations, orthopnea, leg swelling and PND.  Gastrointestinal: Negative for abdominal pain, blood in stool, constipation, diarrhea, heartburn, melena and nausea.  Genitourinary: Negative for dysuria, frequency, hematuria and urgency.  Musculoskeletal: Negative for back pain, joint pain, myalgias and neck pain.  Skin: Negative for rash.  Neurological: Negative for dizziness, tingling, sensory change, focal weakness and headaches.  Endo/Heme/Allergies: Negative for environmental allergies and polydipsia. Does not bruise/bleed easily.  Psychiatric/Behavioral: Negative for depression and suicidal ideas. The patient is not nervous/anxious and does not have insomnia.      Objective  Vitals:   03/24/16 0801  BP:  138/68  Pulse: 74  Temp: 98.7 F (37.1 C)  Weight: 167 lb (75.8 kg)  Height: 5\' 6"  (1.676 m)    Physical Exam  Constitutional: She is well-developed, well-nourished, and in no distress. No distress.  HENT:  Head: Normocephalic and atraumatic.  Right Ear: Tympanic membrane, external ear and ear canal normal.  Left Ear: Tympanic membrane, external ear and ear canal normal.  Nose: Nose normal.   Mouth/Throat: Oropharynx is clear and moist.  Eyes: Conjunctivae and EOM are normal. Pupils are equal, round, and reactive to light. Right eye exhibits no discharge. Left eye exhibits no discharge.  Neck: Normal range of motion. Neck supple. No JVD present. No thyromegaly present.  Cardiovascular: Normal rate, regular rhythm, normal heart sounds and intact distal pulses.  Exam reveals no gallop and no friction rub.   No murmur heard. Pulmonary/Chest: Effort normal and breath sounds normal. She has no wheezes. She has no rales.  Abdominal: Soft. Bowel sounds are normal. She exhibits no mass. There is no tenderness. There is no guarding.  Musculoskeletal: Normal range of motion. She exhibits no edema.  Lymphadenopathy:    She has no cervical adenopathy.  Neurological: She is alert.  Skin: Skin is warm and dry. She is not diaphoretic.  Psychiatric: Mood and affect normal.  Nursing note and vitals reviewed.     Assessment & Plan  Problem List Items Addressed This Visit      Cardiovascular and Mediastinum   Hypertension - Primary   Relevant Medications   lisinopril-hydrochlorothiazide (PRINZIDE,ZESTORETIC) 20-12.5 MG tablet   Other Relevant Orders   Renal Function Panel    Other Visit Diagnoses    Immunization due       prescription for zostavax   Relevant Orders   Tdap vaccine greater than or equal to 7yo IM        Dr. Hayden Rasmusseneanna Ivette Castronova [facility]  03/24/16

## 2016-03-25 ENCOUNTER — Ambulatory Visit: Payer: PRIVATE HEALTH INSURANCE | Admitting: Family Medicine

## 2016-03-25 LAB — RENAL FUNCTION PANEL
Albumin: 4.1 g/dL (ref 3.6–4.8)
BUN / CREAT RATIO: 27 (ref 12–28)
BUN: 22 mg/dL (ref 8–27)
CO2: 26 mmol/L (ref 18–29)
CREATININE: 0.82 mg/dL (ref 0.57–1.00)
Calcium: 9.1 mg/dL (ref 8.7–10.3)
Chloride: 100 mmol/L (ref 96–106)
GFR, EST AFRICAN AMERICAN: 89 mL/min/{1.73_m2} (ref >59)
GFR, EST NON AFRICAN AMERICAN: 77 mL/min/{1.73_m2} (ref >59)
Glucose: 91 mg/dL (ref 65–99)
Phosphorus: 3.5 mg/dL (ref 2.5–4.5)
Potassium: 4.4 mmol/L (ref 3.5–5.2)
SODIUM: 141 mmol/L (ref 134–144)

## 2016-08-23 ENCOUNTER — Encounter: Payer: Self-pay | Admitting: Obstetrics & Gynecology

## 2016-08-23 ENCOUNTER — Ambulatory Visit (INDEPENDENT_AMBULATORY_CARE_PROVIDER_SITE_OTHER): Payer: 59 | Admitting: Obstetrics & Gynecology

## 2016-08-23 VITALS — BP 120/80 | HR 70 | Ht 66.0 in | Wt 157.0 lb

## 2016-08-23 DIAGNOSIS — Z0000 Encounter for general adult medical examination without abnormal findings: Secondary | ICD-10-CM | POA: Diagnosis not present

## 2016-08-23 DIAGNOSIS — Z1231 Encounter for screening mammogram for malignant neoplasm of breast: Secondary | ICD-10-CM

## 2016-08-23 DIAGNOSIS — Z1239 Encounter for other screening for malignant neoplasm of breast: Secondary | ICD-10-CM

## 2016-08-23 DIAGNOSIS — L900 Lichen sclerosus et atrophicus: Secondary | ICD-10-CM | POA: Diagnosis not present

## 2016-08-23 MED ORDER — CLOBETASOL PROPIONATE 0.05 % EX LOTN: TOPICAL_LOTION | CUTANEOUS | 3 refills | Status: DC

## 2016-08-23 NOTE — Progress Notes (Signed)
HPI:      Ebony Harris is a 61 y.o. [phone] who LMP was in the past, she presents today for her annual examination.  The patient has no complaints today. The patient is sexually active. Herlast pap: was normal and 2017 and last mammogram: was normal.  The patient does perform self breast exams.  There is notable family history of breast or ovarian cancer in her family. The patient is not taking hormone replacement therapy. Patient denies post-menopausal vaginal bleeding.   The patient has regular exercise: yes. The patient denies current symptoms of depression.    GYN Hx: Last Colonoscopy:6 mos ago. Normal.  Last DEXA: never ago.    PMHx: Past Medical History:  Diagnosis Date  . Family history of breast cancer   . Fibrocystic breast changes   . Heart murmur   . Hypertension   . Lichen sclerosus   . Wears contact lenses    Past Surgical History:  Procedure Laterality Date  . ABDOMINAL HYSTERECTOMY     partial  . BREAST BIOPSY Left    03/2014 negative  . BREAST BIOPSY Left    1992 negative  . BREAST CYST ASPIRATION Left    negative  . COLONOSCOPY WITH PROPOFOL N/A 02/09/2016   Procedure: COLONOSCOPY WITH PROPOFOL;  Surgeon: Midge Minium, MD;  Location: [facility];  Service: Endoscopy;  Laterality: N/A;  . POLYPECTOMY  02/09/2016   Procedure: POLYPECTOMY;  Surgeon: Midge Minium, MD;  Location: [facility];  Service: Endoscopy;;   Family History  Problem Relation Age of Onset  . Breast cancer Daughter 9  . Heart disease Father   . Hypertension Father   . Breast cancer Paternal Aunt 105   Social History  Substance Use Topics  . Smoking status: Former Smoker    Quit date: 04/19/1980  . Smokeless tobacco: Never Used  . Alcohol use 8.4 oz/week    14 Glasses of wine per week    Current Outpatient Prescriptions:  .  aspirin EC 81 MG tablet, Take 81 mg by mouth daily., Disp: , Rfl:  .  Clobetasol Propionate 0.05 % lotion, Apply to affected areas daily for one  week when having symptoms., Disp: 59 mL, Rfl: 3 .  lisinopril-hydrochlorothiazide (PRINZIDE,ZESTORETIC) 20-12.5 MG tablet, Take 1 tablet by mouth daily., Disp: 90 tablet, Rfl: 3 .  Multiple Vitamins-Minerals (MULTIVITAMIN WITH MINERALS) tablet, Take 1 tablet by mouth daily., Disp: , Rfl:  .  azithromycin (ZITHROMAX) 250 MG tablet, Use as directed (Patient not taking: Reported on 08/23/2016), Disp: 6 tablet, Rfl: 0 .  chlorpheniramine-HYDROcodone (TUSSIONEX PENNKINETIC ER) 10-8 MG/5ML SUER, Take 5 mLs by mouth every 12 (twelve) hours as needed for cough. (Patient not taking: Reported on 08/23/2016), Disp: 115 mL, Rfl: 0 .  famciclovir (FAMVIR) 500 MG tablet, Take 3 tablets (1,500 mg total) by mouth once. (Patient not taking: Reported on 08/23/2016), Disp: 6 tablet, Rfl: 0 .  fluticasone (FLONASE) 50 MCG/ACT nasal spray, Place 2 sprays into both nostrils daily., Disp: 16 g, Rfl: 0 Allergies: Patient has no known allergies.  Review of Systems  Constitutional: Negative for chills, fever and malaise/fatigue.  HENT: Negative for congestion, sinus pain and sore throat.   Eyes: Negative for blurred vision and pain.  Respiratory: Negative for cough and wheezing.   Cardiovascular: Negative for chest pain and leg swelling.  Gastrointestinal: Negative for abdominal pain, constipation, diarrhea, heartburn, nausea and vomiting.  Genitourinary: Negative for dysuria, frequency, hematuria and urgency.  Musculoskeletal: Negative for back pain, joint  pain, myalgias and neck pain.  Skin: Negative for itching and rash.  Neurological: Negative for dizziness, tremors and weakness.  Endo/Heme/Allergies: Does not bruise/bleed easily.  Psychiatric/Behavioral: Negative for depression. The patient is not nervous/anxious and does not have insomnia.     Objective: BP 120/80   Pulse 70   Ht 5\' 6"  (1.676 m)   Wt 157 lb (71.2 kg)   BMI 25.34 kg/m   Filed Weights   08/23/16 1527  Weight: 157 lb (71.2 kg)   Body mass index  is 25.34 kg/m. Physical Exam  Constitutional: She is oriented to person, place, and time. She appears well-developed and well-nourished. No distress.  Genitourinary: Rectum normal and vagina normal. Pelvic exam was performed with patient supine. There is no rash or lesion on the right labia. There is no rash or lesion on the left labia. Vagina exhibits no lesion. No bleeding in the vagina. Right adnexum does not display mass and does not display tenderness. Left adnexum does not display mass and does not display tenderness.  Genitourinary Comments: Absent Uterus Absent cervix Vaginal cuff well healed No change in vulvar skin changes c/w LS  HENT:  Head: Normocephalic and atraumatic. Head is without laceration.  Right Ear: Hearing normal.  Left Ear: Hearing normal.  Nose: No epistaxis.  No foreign bodies.  Mouth/Throat: Uvula is midline, oropharynx is clear and moist and mucous membranes are normal.  Eyes: Pupils are equal, round, and reactive to light.  Neck: Normal range of motion. Neck supple. No thyromegaly present.  Cardiovascular: Normal rate and regular rhythm.  Exam reveals no gallop and no friction rub.   No murmur heard. Pulmonary/Chest: Effort normal and breath sounds normal. No respiratory distress. She has no wheezes. Right breast exhibits no mass, no skin change and no tenderness. Left breast exhibits no mass, no skin change and no tenderness.  Abdominal: Soft. Bowel sounds are normal. She exhibits no distension. There is no tenderness. There is no rebound.  Musculoskeletal: Normal range of motion.  Neurological: She is alert and oriented to person, place, and time. No cranial nerve deficit.  Skin: Skin is warm and dry.  Psychiatric: She has a normal mood and affect. Judgment normal.  Vitals reviewed.   Assessment: Annual Exam 1. Annual physical exam   2. Screening for breast cancer   3. Lichen sclerosus     Plan:            1.  Cervical Screening-  Pap smear schedule  reviewed with patient, due 2022  2. Breast screening- Exam annually and mammogram scheduled  3. Colonoscopy every 10 years, Hemoccult testing after age 62  4. Labs managed by PCP  5. Counseling for hormonal therapy: none  6. Clobetasol for LS  7. h/o high cholesterol thru PCP.  Try DHA or Red yeast rice and f/u there for lab recheck.     F/U  Return in about 1 year (around 08/23/2017) for Annual.  Annamarie MajorPaul Alyxis Grippi, MD, Merlinda FrederickFACOG [facility], [facility] 08/23/2016  4:01 PM

## 2016-08-25 ENCOUNTER — Encounter: Payer: Self-pay | Admitting: Obstetrics and Gynecology

## 2016-08-30 ENCOUNTER — Other Ambulatory Visit: Payer: Self-pay

## 2016-08-30 ENCOUNTER — Telehealth: Payer: Self-pay

## 2016-08-30 NOTE — Telephone Encounter (Signed)
Pt called in to get the "new shingles vaccine" called into [facility] [place] and needs to sched appt for med refill to get lipids drawn per cardiology

## 2016-09-06 ENCOUNTER — Telehealth: Payer: Self-pay | Admitting: Obstetrics & Gynecology

## 2016-09-06 NOTE — Telephone Encounter (Signed)
Pt is calling about her bill. Pt is saying her insurance isn't paying for her annual and prior visit and February. Pt would like a call back please.

## 2016-09-06 NOTE — Telephone Encounter (Signed)
Maybe to tax Id is different due to insurance being from [employer]?

## 2016-09-27 ENCOUNTER — Encounter: Payer: Self-pay | Admitting: Family Medicine

## 2016-09-27 ENCOUNTER — Ambulatory Visit (INDEPENDENT_AMBULATORY_CARE_PROVIDER_SITE_OTHER): Payer: PRIVATE HEALTH INSURANCE | Admitting: Family Medicine

## 2016-09-27 VITALS — BP 122/64 | HR 78 | Ht 66.0 in | Wt 158.0 lb

## 2016-09-27 DIAGNOSIS — E7800 Pure hypercholesterolemia, unspecified: Secondary | ICD-10-CM

## 2016-09-27 DIAGNOSIS — I10 Essential (primary) hypertension: Secondary | ICD-10-CM

## 2016-09-27 MED ORDER — LISINOPRIL-HYDROCHLOROTHIAZIDE 20-12.5 MG PO TABS: 1.0000 | ORAL_TABLET | Freq: Every day | ORAL | 3 refills | Status: DC

## 2016-09-27 NOTE — Progress Notes (Signed)
Name: Ebony Harris   MRN: [id number]    DOB: May 31, 1955   Date:09/27/2016       Progress Note  Subjective  Chief Complaint  Chief Complaint  Patient presents with  . Hypertension    needs med refilled and chol recheck    Hypertension  This is a chronic problem. The current episode started more than 1 year ago. The problem is unchanged. The problem is controlled. Pertinent negatives include no anxiety, blurred vision, chest pain, headaches, malaise/fatigue, neck pain, orthopnea, palpitations, peripheral edema, PND, shortness of breath or sweats. There are no associated agents to hypertension. There are no known risk factors for coronary artery disease. Past treatments include ACE inhibitors and diuretics. The current treatment provides moderate improvement. There are no compliance problems.  There is no history of angina, kidney disease, CAD/MI, CVA, heart failure, left ventricular hypertrophy, PVD or retinopathy. There is no history of chronic renal disease, a hypertension causing med or renovascular disease.  Hyperlipidemia  This is a chronic problem. The current episode started more than 1 year ago. The problem is controlled. Recent lipid tests were reviewed and are normal. She has no history of chronic renal disease, diabetes, hypothyroidism, liver disease, obesity or nephrotic syndrome. There are no known factors aggravating her hyperlipidemia. Pertinent negatives include no chest pain, focal sensory loss, focal weakness, leg pain, myalgias or shortness of breath. She is currently on no antihyperlipidemic treatment. The current treatment provides significant improvement of lipids. There are no compliance problems.  Risk factors for coronary artery disease include dyslipidemia and hypertension.    No problem-specific Assessment & Plan notes found for this encounter.   Past Medical History:  Diagnosis Date  . Family history of breast cancer    daughter was BRCA neg; pt qualifies for My Risk  testing; letter sent 5/18  . Fibrocystic breast changes   . Heart murmur   . Hypertension   . Lichen sclerosus   . Wears contact lenses     Past Surgical History:  Procedure Laterality Date  . ABDOMINAL HYSTERECTOMY     partial  . BREAST BIOPSY Left    03/2014 negative  . BREAST BIOPSY Left    1992 negative  . BREAST CYST ASPIRATION Left    negative  . COLONOSCOPY WITH PROPOFOL N/A 02/09/2016   Procedure: COLONOSCOPY WITH PROPOFOL;  Surgeon: Lucilla Lame, MD;  Location: [place];  Service: Endoscopy;  Laterality: N/A;  . POLYPECTOMY  02/09/2016   Procedure: POLYPECTOMY;  Surgeon: Lucilla Lame, MD;  Location: [place];  Service: Endoscopy;;    Family History  Problem Relation Age of Onset  . Breast cancer Daughter 76  . Heart disease Father   . Hypertension Father   . Breast cancer Paternal Aunt 67    Social History   Social History  . Marital status: Married    Spouse name: N/A  . Number of children: N/A  . Years of education: N/A   Occupational History  . Not on file.   Social History Main Topics  . Smoking status: Former Smoker    Quit date: 04/19/1980  . Smokeless tobacco: Never Used  . Alcohol use 8.4 oz/week    14 Glasses of wine per week  . Drug use: No  . Sexual activity: No   Other Topics Concern  . Not on file   Social History Narrative  . No narrative on file    No Known Allergies  Outpatient Medications Prior to Visit  Medication Sig  Dispense Refill  . aspirin EC 81 MG tablet Take 81 mg by mouth daily.    . Clobetasol Propionate 0.05 % lotion Apply to affected areas daily for one week when having symptoms. 59 mL 3  . fluticasone (FLONASE) 50 MCG/ACT nasal spray Place 2 sprays into both nostrils daily. 16 g 0  . Multiple Vitamins-Minerals (MULTIVITAMIN WITH MINERALS) tablet Take 1 tablet by mouth daily.    Marland Kitchen lisinopril-hydrochlorothiazide (PRINZIDE,ZESTORETIC) 20-12.5 MG tablet Take 1 tablet by mouth daily. 90 tablet 3  .  famciclovir (FAMVIR) 500 MG tablet Take 3 tablets (1,500 mg total) by mouth once. (Patient not taking: Reported on 08/23/2016) 6 tablet 0  . azithromycin (ZITHROMAX) 250 MG tablet Use as directed (Patient not taking: Reported on 08/23/2016) 6 tablet 0  . chlorpheniramine-HYDROcodone (TUSSIONEX PENNKINETIC ER) 10-8 MG/5ML SUER Take 5 mLs by mouth every 12 (twelve) hours as needed for cough. (Patient not taking: Reported on 08/23/2016) 115 mL 0   No facility-administered medications prior to visit.     Review of Systems  Constitutional: Negative for chills, fever, malaise/fatigue and weight loss.  HENT: Negative for ear discharge, ear pain and sore throat.   Eyes: Negative for blurred vision.  Respiratory: Negative for cough, sputum production, shortness of breath and wheezing.   Cardiovascular: Negative for chest pain, palpitations, orthopnea, leg swelling and PND.  Gastrointestinal: Negative for abdominal pain, blood in stool, constipation, diarrhea, heartburn, melena and nausea.  Genitourinary: Negative for dysuria, frequency, hematuria and urgency.  Musculoskeletal: Negative for back pain, joint pain, myalgias and neck pain.  Skin: Negative for rash.  Neurological: Negative for dizziness, tingling, sensory change, focal weakness and headaches.  Endo/Heme/Allergies: Negative for environmental allergies and polydipsia. Does not bruise/bleed easily.  Psychiatric/Behavioral: Negative for depression and suicidal ideas. The patient is not nervous/anxious and does not have insomnia.      Objective  Vitals:   09/27/16 0827  BP: 122/64  Pulse: 78  Weight: 158 lb (71.7 kg)  Height: '5\' 6"'  (1.676 m)    Physical Exam  Constitutional: She is well-developed, well-nourished, and in no distress. No distress.  HENT:  Head: Normocephalic and atraumatic.  Right Ear: External ear normal.  Left Ear: External ear normal.  Nose: Nose normal.  Mouth/Throat: Oropharynx is clear and moist.  Eyes:  Conjunctivae and EOM are normal. Pupils are equal, round, and reactive to light. Right eye exhibits no discharge. Left eye exhibits no discharge.  Neck: Normal range of motion. Neck supple. No JVD present. No thyromegaly present.  Cardiovascular: Normal rate, regular rhythm, normal heart sounds and intact distal pulses.  Exam reveals no gallop and no friction rub.   No murmur heard. Pulmonary/Chest: Effort normal and breath sounds normal. She has no wheezes. She has no rales.  Abdominal: Soft. Bowel sounds are normal. She exhibits no mass. There is no tenderness. There is no guarding.  Musculoskeletal: Normal range of motion. She exhibits no edema.  Lymphadenopathy:    She has no cervical adenopathy.  Neurological: She is alert. She has normal reflexes.  Skin: Skin is warm and dry. She is not diaphoretic.  Psychiatric: Mood and affect normal.  Nursing note and vitals reviewed.     Assessment & Plan  Problem List Items Addressed This Visit      Cardiovascular and Mediastinum   Hypertension - Primary   Relevant Medications   lisinopril-hydrochlorothiazide (PRINZIDE,ZESTORETIC) 20-12.5 MG tablet   Other Relevant Orders   Renal Function Panel    Other Visit Diagnoses  Pure hypercholesterolemia       Relevant Medications   lisinopril-hydrochlorothiazide (PRINZIDE,ZESTORETIC) 20-12.5 MG tablet   Other Relevant Orders   Lipid Profile      Meds ordered this encounter  Medications  . lisinopril-hydrochlorothiazide (PRINZIDE,ZESTORETIC) 20-12.5 MG tablet    Sig: Take 1 tablet by mouth daily.    Dispense:  90 tablet    Refill:  3      Dr. Otilio Miu [facility]  09/27/16

## 2016-09-28 LAB — RENAL FUNCTION PANEL
ALBUMIN: 4.2 g/dL (ref 3.6–4.8)
BUN/Creatinine Ratio: 29 — ABNORMAL HIGH (ref 12–28)
BUN: 22 mg/dL (ref 8–27)
CO2: 27 mmol/L (ref 20–29)
CREATININE: 0.76 mg/dL (ref 0.57–1.00)
Calcium: 9 mg/dL (ref 8.7–10.3)
Chloride: 103 mmol/L (ref 96–106)
GFR calc Af Amer: 98 mL/min/{1.73_m2} (ref >59)
GFR, EST NON AFRICAN AMERICAN: 85 mL/min/{1.73_m2} (ref >59)
Glucose: 92 mg/dL (ref 65–99)
PHOSPHORUS: 3.4 mg/dL (ref 2.5–4.5)
POTASSIUM: 4.7 mmol/L (ref 3.5–5.2)
Sodium: 143 mmol/L (ref 134–144)

## 2016-09-28 LAB — LIPID PANEL
CHOL/HDL RATIO: 4.2 ratio (ref 0.0–4.4)
Cholesterol, Total: 224 mg/dL — ABNORMAL HIGH (ref 100–199)
HDL: 53 mg/dL (ref >39)
LDL Calculated: 140 mg/dL — ABNORMAL HIGH (ref 0–99)
TRIGLYCERIDES: 155 mg/dL — AB (ref 0–149)
VLDL CHOLESTEROL CAL: 31 mg/dL (ref 5–40)

## 2017-01-03 ENCOUNTER — Ambulatory Visit
Admission: RE | Admit: 2017-01-03 | Discharge: 2017-01-03 | Disposition: A | Discharge status: Home / Self Care | Payer: 59 | Source: Ambulatory Visit | Attending: Obstetrics & Gynecology | Admitting: Obstetrics & Gynecology

## 2017-01-03 DIAGNOSIS — Z1239 Encounter for other screening for malignant neoplasm of breast: Secondary | ICD-10-CM

## 2017-01-03 DIAGNOSIS — Z1231 Encounter for screening mammogram for malignant neoplasm of breast: Secondary | ICD-10-CM | POA: Diagnosis present

## 2017-03-25 ENCOUNTER — Encounter: Payer: Self-pay | Admitting: Family Medicine

## 2017-03-25 ENCOUNTER — Ambulatory Visit (INDEPENDENT_AMBULATORY_CARE_PROVIDER_SITE_OTHER): Payer: PRIVATE HEALTH INSURANCE | Admitting: Family Medicine

## 2017-03-25 VITALS — BP 120/78 | HR 64 | Ht 66.0 in | Wt 156.0 lb

## 2017-03-25 DIAGNOSIS — Z1159 Encounter for screening for other viral diseases: Secondary | ICD-10-CM | POA: Diagnosis not present

## 2017-03-25 DIAGNOSIS — I10 Essential (primary) hypertension: Secondary | ICD-10-CM | POA: Diagnosis not present

## 2017-03-25 DIAGNOSIS — Z114 Encounter for screening for human immunodeficiency virus [HIV]: Secondary | ICD-10-CM | POA: Diagnosis not present

## 2017-03-25 DIAGNOSIS — Z23 Encounter for immunization: Secondary | ICD-10-CM

## 2017-03-25 NOTE — Progress Notes (Signed)
Name: Ebony Harris   MRN: [id number]    DOB: 07/25/1955   Date:03/25/2017       Progress Note  Subjective  Chief Complaint  Chief Complaint  Patient presents with  . Annual Exam    needs flu shot- everything else UTD    Patient present for annual physical exam.  Hypertension  This is a chronic problem. The current episode started more than 1 year ago. The problem is unchanged. The problem is controlled. Pertinent negatives include no blurred vision, chest pain, headaches, malaise/fatigue, neck pain, orthopnea, palpitations, peripheral edema, PND or shortness of breath. Past treatments include diuretics and ACE inhibitors. The current treatment provides moderate improvement.    No problem-specific Assessment & Plan notes found for this encounter.   Past Medical History:  Diagnosis Date  . Family history of breast cancer    daughter was BRCA neg; pt qualifies for My Risk testing; letter sent 5/18  . Fibrocystic breast changes   . Heart murmur   . Hypertension   . Lichen sclerosus   . Wears contact lenses     Past Surgical History:  Procedure Laterality Date  . ABDOMINAL HYSTERECTOMY     partial  . BREAST BIOPSY Left    03/2014 negative  . BREAST BIOPSY Left    1992 negative  . BREAST CYST ASPIRATION Left    negative  . COLONOSCOPY WITH PROPOFOL N/A 02/09/2016   Procedure: COLONOSCOPY WITH PROPOFOL;  Surgeon: Lucilla Lame, MD;  Location: [place];  Service: Endoscopy;  Laterality: N/A;  . POLYPECTOMY  02/09/2016   Procedure: POLYPECTOMY;  Surgeon: Lucilla Lame, MD;  Location: [place];  Service: Endoscopy;;    Family History  Problem Relation Age of Onset  . Breast cancer Daughter 51  . Heart disease Father   . Hypertension Father   . Breast cancer Paternal Aunt 17    Social History   Socioeconomic History  . Marital status: Married    Spouse name: Not on file  . Number of children: Not on file  . Years of education: Not on file  . Highest  education level: Not on file  Social Needs  . Financial resource strain: Not on file  . Food insecurity - worry: Not on file  . Food insecurity - inability: Not on file  . Transportation needs - medical: Not on file  . Transportation needs - non-medical: Not on file  Occupational History  . Not on file  Tobacco Use  . Smoking status: Former Smoker    Last attempt to quit: 04/19/1980    Years since quitting: 36.9  . Smokeless tobacco: Never Used  Substance and Sexual Activity  . Alcohol use: Yes    Alcohol/week: 8.4 oz    Types: 14 Glasses of wine per week  . Drug use: No  . Sexual activity: No  Other Topics Concern  . Not on file  Social History Narrative  . Not on file    No Known Allergies  Outpatient Medications Prior to Visit  Medication Sig Dispense Refill  . aspirin EC 81 MG tablet Take 81 mg by mouth daily.    . Clobetasol Propionate 0.05 % lotion Apply to affected areas daily for one week when having symptoms. 59 mL 3  . lisinopril-hydrochlorothiazide (PRINZIDE,ZESTORETIC) 20-12.5 MG tablet Take 1 tablet by mouth daily. 90 tablet 3  . Multiple Vitamins-Minerals (MULTIVITAMIN WITH MINERALS) tablet Take 1 tablet by mouth daily.    . famciclovir (FAMVIR) 500 MG tablet Take  3 tablets (1,500 mg total) by mouth once. (Patient not taking: Reported on 08/23/2016) 6 tablet 0  . fluticasone (FLONASE) 50 MCG/ACT nasal spray Place 2 sprays into both nostrils daily. (Patient not taking: Reported on 03/25/2017) 16 g 0   No facility-administered medications prior to visit.     Review of Systems  Constitutional: Negative for chills, diaphoresis, fever, malaise/fatigue and weight loss.       Nocturnal cramps  HENT: Negative for ear discharge, ear pain and sore throat.   Eyes: Negative for blurred vision.  Respiratory: Negative for cough, sputum production, shortness of breath and wheezing.   Cardiovascular: Negative for chest pain, palpitations, orthopnea, leg swelling and PND.   Gastrointestinal: Negative for abdominal pain, blood in stool, constipation, diarrhea, heartburn, melena and nausea.  Genitourinary: Negative for dysuria, frequency, hematuria and urgency.  Musculoskeletal: Negative for back pain, joint pain, myalgias and neck pain.  Skin: Negative for rash.  Neurological: Negative for dizziness, tingling, sensory change, focal weakness, weakness and headaches.  Endo/Heme/Allergies: Negative for environmental allergies and polydipsia. Does not bruise/bleed easily.  Psychiatric/Behavioral: Negative for depression and suicidal ideas. The patient is not nervous/anxious and does not have insomnia.      Objective  Vitals:   03/25/17 0848  BP: 120/78  Pulse: 64  Weight: 156 lb (70.8 kg)  Height: '5\' 6"'  (1.676 m)    Physical Exam  Constitutional: She is oriented to person, place, and time and well-developed, well-nourished, and in no distress. No distress.  HENT:  Head: Normocephalic and atraumatic.  Right Ear: Tympanic membrane, external ear and ear canal normal.  Left Ear: Tympanic membrane, external ear and ear canal normal.  Nose: Nose normal.  Mouth/Throat: Uvula is midline, oropharynx is clear and moist and mucous membranes are normal.  Eyes: Conjunctivae, EOM and lids are normal. Pupils are equal, round, and reactive to light. Right eye exhibits no discharge. Left eye exhibits no discharge.  Fundoscopic exam:      The right eye shows no arteriolar narrowing and no AV nicking.       The left eye shows no arteriolar narrowing and no AV nicking.  Neck: Trachea normal and normal range of motion. Neck supple. Normal carotid pulses, no hepatojugular reflux and no JVD present. Carotid bruit is not present. No thyromegaly present.  Cardiovascular: Normal rate, regular rhythm, S1 normal, S2 normal, normal heart sounds, intact distal pulses and normal pulses. PMI is not displaced. Exam reveals no gallop, no S3, no S4 and no friction rub.  No murmur  heard. Pulmonary/Chest: Effort normal and breath sounds normal. No accessory muscle usage. No respiratory distress. She has no wheezes. She has no rales.  Abdominal: Soft. Bowel sounds are normal. She exhibits no mass. There is no hepatosplenomegaly. There is no tenderness. There is no rebound, no guarding and no CVA tenderness.  Musculoskeletal: Normal range of motion. She exhibits no edema.  Lymphadenopathy:       Head (right side): No submandibular adenopathy present.       Head (left side): No submandibular adenopathy present.    She has no cervical adenopathy.       Right cervical: No superficial cervical adenopathy present.      Left cervical: No superficial cervical adenopathy present.    She has no axillary adenopathy.  Neurological: She is alert and oriented to person, place, and time. She has normal motor skills, normal sensation, normal strength, normal reflexes and intact cranial nerves.  Skin: Skin is warm, dry and  intact. She is not diaphoretic.  Psychiatric: Mood and affect normal.  Nursing note and vitals reviewed.     Assessment & Plan  Problem List Items Addressed This Visit      Cardiovascular and Mediastinum   Hypertension - Primary    Other Visit Diagnoses    Influenza vaccine needed       Relevant Orders   Flu Vaccine QUAD 36+ mos IM (Completed)   Need for hepatitis C screening test       Relevant Orders   Hepatitis C antibody   Encounter for screening for HIV       Relevant Orders   HIV antibody      No orders of the defined types were placed in this encounter.     Dr. [facility]  03/25/17

## 2017-03-26 LAB — HIV ANTIBODY (ROUTINE TESTING W REFLEX): HIV SCREEN 4TH GENERATION: NONREACTIVE

## 2017-03-26 LAB — HEPATITIS C ANTIBODY: Hep C Virus Ab: 0.1 s/co ratio (ref 0.0–0.9)

## 2017-03-30 ENCOUNTER — Other Ambulatory Visit: Payer: Self-pay

## 2017-03-30 DIAGNOSIS — I10 Essential (primary) hypertension: Secondary | ICD-10-CM

## 2017-03-30 MED ORDER — LISINOPRIL-HYDROCHLOROTHIAZIDE 20-12.5 MG PO TABS: 1.0000 | ORAL_TABLET | Freq: Every day | ORAL | 1 refills | Status: DC

## 2017-09-23 ENCOUNTER — Other Ambulatory Visit: Payer: Self-pay | Admitting: Family Medicine

## 2017-09-23 DIAGNOSIS — I10 Essential (primary) hypertension: Secondary | ICD-10-CM

## 2017-09-30 ENCOUNTER — Ambulatory Visit (INDEPENDENT_AMBULATORY_CARE_PROVIDER_SITE_OTHER): Payer: PRIVATE HEALTH INSURANCE | Admitting: Family Medicine

## 2017-09-30 ENCOUNTER — Encounter: Payer: Self-pay | Admitting: Family Medicine

## 2017-09-30 ENCOUNTER — Ambulatory Visit
Admission: RE | Admit: 2017-09-30 | Discharge: 2017-09-30 | Disposition: A | Discharge status: Home / Self Care | Payer: 59 | Source: Ambulatory Visit | Attending: Family Medicine | Admitting: Family Medicine

## 2017-09-30 VITALS — BP 120/70 | HR 60 | Ht 66.0 in | Wt 154.0 lb

## 2017-09-30 DIAGNOSIS — S63642A Sprain of metacarpophalangeal joint of left thumb, initial encounter: Secondary | ICD-10-CM

## 2017-09-30 DIAGNOSIS — X58XXXA Exposure to other specified factors, initial encounter: Secondary | ICD-10-CM | POA: Insufficient documentation

## 2017-09-30 DIAGNOSIS — E782 Mixed hyperlipidemia: Secondary | ICD-10-CM

## 2017-09-30 DIAGNOSIS — I10 Essential (primary) hypertension: Secondary | ICD-10-CM | POA: Diagnosis not present

## 2017-09-30 MED ORDER — LISINOPRIL-HYDROCHLOROTHIAZIDE 20-12.5 MG PO TABS: 1.0000 | ORAL_TABLET | Freq: Every day | ORAL | 1 refills | Status: DC

## 2017-09-30 MED ORDER — MELOXICAM 15 MG PO TABS: 15.0000 mg | ORAL_TABLET | Freq: Every day | ORAL | 0 refills | Status: DC

## 2017-09-30 NOTE — Assessment & Plan Note (Signed)
Continue on meds.

## 2017-09-30 NOTE — Patient Instructions (Addendum)
GUIDELINES FOR  LOW-CHOLESTEROL, LOW-TRIGLYCERIDE DIETS    FOODS TO USE   MEATS, FISH Choose lean meats (chicken, turkey, veal, and non-fatty cuts of beef with excess fat trimmed; one serving = 3 oz of cooked meat). Also, fresh or frozen fish, canned fish packed in water, and shellfish (lobster, crabs, shrimp, and oysters). Limit use to no more than one serving of one of these per week. Shellfish are high in cholesterol but low in saturated fat and should be used sparingly. Meats and fish should be broiled (pan or oven) or baked on a rack.  EGGS Egg substitutes and egg whites (use freely). Egg yolks (limit two per week).  FRUITS Eat three servings of fresh fruit per day (1 serving =  cup). Be sure to have at least one citrus fruit daily. Frozen and canned fruit with no sugar or syrup added may be used.  VEGETABLES Most vegetables are not limited (see next page). One dark-green (string beans, escarole) or one deep yellow (squash) vegetable is recommended daily. Cauliflower, broccoli, and celery, as well as potato skins, are recommended for their fiber content. (Fiber is associated with cholesterol reduction) It is preferable to steam vegetables, but they may be boiled, strained, or braised with polyunsaturated vegetable oil (see below).  BEANS Dried peas or beans (1 serving =  cup) may be used as a bread substitute.  NUTS Almonds, walnuts, and peanuts may be used sparingly  (1 serving = 1 Tablespoonful). Use pumpkin, sesame, or sunflower seeds.  BREADS, GRAINS One roll or one slice of whole grain or enriched bread may be used, or three soda crackers or four pieces of melba toast as a substitute. Spaghetti, rice or noodles ( cup) or  large ear of corn may be used as a bread substitute. In preparing these foods do not use butter or shortening, use soft margarine. Also use egg and sugar substitutes.  Choose high fiber grains, such as oats and whole wheat.  CEREALS Use  cup of hot cereal or  cup of  cold cereal per day. Add a sugar substitute if desired, with 99% fat free or skim milk.  MILK PRODUCTS Always use 99% fat free or skim milk, dairy products such as low fat cheeses (farmer's uncreamed diet cottage), low-fat yogurt, and powdered skim milk.  FATS, OILS Use soft (not stick) margarine; vegetable oils that are high in polyunsaturated fats (such as safflower, sunflower, soybean, corn, and cottonseed). Always refrigerate meat drippings to harden the fat and remove it before preparing gravies  DESSERTS, SNACKS Limit to two servings per day; substitute each serving for a bread/cereal serving: ice milk, water sherbet (1/4 cup); unflavored gelatin or gelatin flavored with sugar substitute (1/3 cup); pudding prepared with skim milk (1/2 cup); egg white souffls; unbuttered popcorn (1  cups). Substitute carob for chocolate.  BEVERAGES Fresh fruit juices (limit 4 oz per day); black coffee, plain or herbal teas; soft drinks with sugar substitutes; club soda, preferably salt-free; cocoa made with skim milk or nonfat dried milk and water (sugar substitute added if desired); clear broth. Alcohol: limit two servings per day (see second page).  MISCELLANEOUS  You may use the following freely: vinegar, spices, herbs, nonfat bouillon, mustard, Worcestershire sauce, soy sauce, flavoring essence.                  GUIDELINES FOR  LOW-CHOLESTEROL, LOW TRIGLYCERIDE DIETS    FOODS TO AVOID   MEATS, FISH Marbled beef, pork, bacon, sausage, and other pork products; fatty   fowl (duck, goose); skin and fat of turkey and chicken; processed meats; luncheon meats (salami, bologna); frankfurters and fast-food hamburgers (theyre loaded with fat); organ meats (kidneys, liver); canned fish packed in oil.  EGGS Limit egg yolks to two per week.   FRUITS Coconuts (rich in saturated fats).  VEGETABLES Avoid avocados. Starchy vegetables (potatoes, corn, lima beans, dried peas, beans) may be used only if  substitutes for a serving of bread or cereal. (Baked potato skin, however, is desirable for its fiber content.  BEANS Commercial baked beans with sugar and/or pork added.  NUTS Avoid nuts.  Limit peanuts and walnuts to one tablespoonful per day.  BREADS, GRAINS Any baked goods with shortening and/or sugar. Commercial mixes with dried eggs and whole milk. Avoid sweet rolls, doughnuts, breakfast pastries (Danish), and sweetened packaged cereals (the added sugar converts readily to triglycerides).  MILK PRODUCTS Whole milk and whole-milk packaged goods; cream; ice cream; whole-milk puddings, yogurt, or cheeses; nondairy cream substitutes.  FATS, OILS Butter, lard, animal fats, bacon drippings, gravies, cream sauces as well as palm and coconut oils. All these are high in saturated fats. Examine labels on cholesterol free products for hydrogenated fats. (These are oils that have been hardened into solids and in the process have become saturated.)  DESSERTS, SNACKS Fried snack foods like potato chips; chocolate; candies in general; jams, jellies, syrups; whole- milk puddings; ice cream and milk sherbets; hydrogenated peanut butter.  BEVERAGES Sugared fruit juices and soft drinks; cocoa made with whole milk and/or sugar. When using alcohol (1 oz liquor, 5 oz beer, or 2  oz dry table wine per serving), one serving must be substituted for one bread or cereal serving (limit, two servings of alcohol per day).   SPECIAL NOTES    1. Remember that even non-limited foods should be used in moderation. 2. While on a cholesterol-lowering diet, be sure to avoid animal fats and marbled meats. 3. 3. While on a triglyceride-lowering diet, be sure to avoid sweets and to control the amount of carbohydrates you eat (starchy foods such as flour, bread, potatoes).While on a tri-glyceride-lowering diet, be sure to avoid sweets 4. Buy a good low-fat cookbook, such as the one published by the American Heart Association. 5. Consult  your physician if you have any questions.               [facility] Low Glycemic Diet Plan   Low Glycemic Foods (20-49) Moderate Glycemic Foods (50-69) High Glycemic Foods (70-100)      Breakfast Creals Breakfast Cereals Breakfast Cereals  All Bran All-Bran Fruit'n Oats   Bran Buds Bran Chex   Cheerios Corn chex    Fiber One Oatmeal (not instant)   Just Right Mini-Wheats   Corn Flakes Cream of Wheat    Oat Bran Special K Swiss Muesli   Grape Nuts Grape Nut Flakes      Grits Nutri-Grain    Fruits and fruit juice: Fruits Puffed Rice Puffed Wheat    (Limit to 1-2 Servings per day) Banana (under-ride) Dates   Rice Chex Rice Krispies    Apples Apricots (fresh/dried)   Figs Grapes   Shredded Wheat Team    Blackberries Blueberries   Kiwi Mango   Total     Cherries Cranberries   Oranges Raisins     Peaches Pears    Fruits  Plums Prunes   Fruit Juices Pineapple Watermelon    Grapefruit Raspberries   Cranberry Juice Orange Juice   Banana (over-ripe)       Strawberries Tangerines      Apple Juice Grapefruit Juice   Beans and Legumes Beverages  Tomato Juice    [place]-type baked beans Sodas, sweet tea, pineapple juice   Canned pinto, kidney, or navy beans   Beans and Legumes (fresh-cooked) Green peas Vegetables  Black-eyed peas Butter Beans    Potato, baked, boiled, fried, mashed  Chick peas Lentils   Vegetables [place] fries  Green beans Lima beans   Beets Carrots   Canned or frozen corn  Kidney beans Navy beans   Sweet potato Yam   Parsnips  Pinto beans Snow peas   Corn on the cob Winter squash      Non-starchy vegetables Grains Breads  Asparagus, avocado, broccoli, cabbage Cornmeal Rice, brown   Most breads (white and whole grain)  cauliflower, celery, cucumber, greens Rice, white Couscous   Bagels Bread sticks    lettuce, mushrooms, peppers, tomatoes  Bread stuffing Kaiser roll    okra, onions, spinach, summer squash Pasta Dinner rolls    [employer], cheese     Grains Ravioli, meat filled Spaghetti, white   Grains  Barley Bulgur    Rice, instant Tapioca, with milk    Rye Wild rice   Nuts    Cashews Macadamia   Candy and most cookies  Nuts and oils    Almonds, peanuts, sunflower seeds Snacks Snacks  hazelnuts, pecans, walnuts Chocolate Ice cream, lowfat   Donuts Corn chips    Oils that are liquid at room temperature Muffin Popcorn   Jelly beans Pretzels      Pastries  Dairy, fish, meat, soy, and eggs    Milk, skim Lowfat cheese    Restaurant and ethnic foods  Yogurt, lowfat, fruit sugar sweetened  Most [place] food (sugar in stir fry    or wok sauce)  Lean red meat Fish    Teriyaki-style meats and vegetables  Skinless chicken and [place], shellfish        Egg whites (up to 3 daily), Soy Products    Egg yolks (up to 7 or _____ per week)      Ulnar Collateral Ligament Injury of the Thumb A ligament is a strong band of tissue that connects and supports bones. Ulnar collateral ligament (UCL) injury happens when the UCL at the base of the thumb is stretched or torn. A tear can be either partial or complete. The severity of the injury depends on how much of the ligament was damaged or torn. The UCL ligament is important for normal use of the thumb. This ligament helps you to use and move your thumb. UCL injury can happen suddenly (acuteinjury) or gradually (chronic injury) with repeated overstretching of the ligament. If it is not treated properly, UCL injury can lead to arthritis. What are the causes? This injury is caused by forcefully moving the thumb past its normal range of motion toward the wrist. If you extend your hands to catch an object or to protect yourself while falling, the force of the impact can cause your ligament to stretch too much. This excess tension can also cause your ligament to tear. What increases the risk? This injury is more likely to occur in:  People who have had a previous thumb  injury or sprain.  People who play contact sports or sports that involve catching balls, such as baseball, basketball, or football.  People who do activities that increase the chance that the thumb will be pulled away from the rest of the hand.  People  who have poor hand strength and flexibility.  People who do not warm up properly before activities.  What are the signs or symptoms? Symptoms of this injury include:  Pain or tenderness over the injured area with movement of the thumb.  Pain when the injured area is pressed.  Bruising or redness at the base of the thumb. This can spread to the whole thumb and part of the hand.  Swelling over the injured area.  Difficulty grasping or pinching with the injured thumb due to weakness or pain.  If the injury is severe, a lump (mass) may be felt under the skin in the injured area. How is this diagnosed? This injury is diagnosed with a medical history and physical exam. You may also have imaging studies, including:  X-ray.  Ultrasound.  MRI.  How is this treated? Treatment varies depending on the severity of your injury. If the UCL is overstretched or partially torn, treatment usually involves keeping your thumb in a fixed position (immobilization) for a period of time. To help you do this, your health care provider will apply a brace, cast, or splint to keep your thumb from moving until it heals. If the UCL is fully torn, you may need surgery to reconnect the ligament to the bone. After surgery, a cast or splint will be applied and it will need to stay on your thumb while it heals. Your health care provider may also suggest exercises or physical therapy to strengthen your thumb. Follow these instructions at home: If you have a cast:  Do not stick anything inside the cast to scratch your skin. Doing that increases your risk of infection.  Check the skin around the cast every day. Report any concerns to your health care provider. You  may put lotion on dry skin around the edges of the cast. Do not apply lotion to the skin underneath the cast.  Keep the cast clean and dry. If you have a splint or brace:  Wear it as told by your health care provider. Remove it only as told by your health care provider.  Loosen it if your fingers become numb and tingle, or if they turn cold and blue.  Keep the brace or splint clean and dry. Bathing  Cover the cast or splint and bandage (dressing) with a watertight plastic bag to protect it from water while you take a bath or shower. Do not let the cast or splint and dressing get wet. Managing pain, stiffness, and swelling  If directed, apply ice to the injured area: ? Put ice in a plastic bag. ? Place a towel between your skin and the bag. ? Leave the ice on for 20 minutes, 2-3 times per day.  Move your fingers often to avoid stiffness and to lessen swelling.  Raise (elevate) the injured area above the level of your heart while you are sitting or lying down. Driving  Do not drive or operate heavy machinery while taking prescription pain medicine.  Ask your health care provider when it is safe to drive if you have a cast, splint, or brace on your hand. General instructions  Do not put pressure on any part of your cast or splint until it is fully hardened. This may take several hours.  Take over-the-counter and prescription medicines only as told by your health care provider.  Keep all follow-up visits as told by your health care provider. This is important.  Do not wear rings on your injured thumb.  Do any  exercise or physical therapy as told by your health care provider. Contact a health care provider if:  Your pain is not controlled with medicine.  Your bruising or swelling gets worse.  Your cast or splint is damaged.  Your thumb is numb or blue.  Your thumb feels colder than normal. This information is not intended to replace advice given to you by your health care  provider. Make sure you discuss any questions you have with your health care provider. Document Released: 04/05/2005 Document Revised: 12/07/2015 Document Reviewed: 06/12/2014 Elsevier Interactive Patient Education  [employer].

## 2017-09-30 NOTE — Progress Notes (Signed)
Name: Ebony Harris   MRN: [id number]    DOB: 05/15/1955   Date:09/30/2017       Progress Note  Subjective  Chief Complaint  Chief Complaint  Patient presents with  . Hypertension    Hypertension  This is a chronic problem. The current episode started more than 1 year ago. The problem is unchanged. The problem is controlled. Pertinent negatives include no anxiety, blurred vision, chest pain, headaches, malaise/fatigue, neck pain, orthopnea, palpitations, peripheral edema, PND, shortness of breath or sweats. There are no associated agents to hypertension. Risk factors for coronary artery disease include dyslipidemia and post-menopausal state. Past treatments include ACE inhibitors and diuretics. The current treatment provides moderate improvement. There are no compliance problems.  There is no history of angina, kidney disease, CAD/MI, CVA, heart failure, left ventricular hypertrophy, PVD or retinopathy. There is no history of chronic renal disease, a hypertension causing med or renovascular disease.  Hyperlipidemia  This is a chronic problem. The current episode started more than 1 year ago. The problem is controlled. Recent lipid tests were reviewed and are normal. She has no history of chronic renal disease, diabetes, hypothyroidism, liver disease, obesity or nephrotic syndrome. Pertinent negatives include no chest pain, focal sensory loss, focal weakness, leg pain, myalgias or shortness of breath. Current antihyperlipidemic treatment includes diet change and exercise. There are no compliance problems.  Risk factors for coronary artery disease include hypertension and dyslipidemia.  Hand Injury   The incident occurred more than 1 week ago (1 month ago). The incident occurred at work. The injury mechanism was repetitive motion (over use of thumb opening plastic). The pain is present in the left hand. The quality of the pain is described as aching. The pain does not radiate. The pain is moderate. The  pain has been constant since the incident. Pertinent negatives include no chest pain, muscle weakness, numbness or tingling. The symptoms are aggravated by movement ("try yo open something"). Treatments tried: cream. The treatment provided no relief.    Hypertension Continue on meds   Past Medical History:  Diagnosis Date  . Family history of breast cancer    daughter was BRCA neg; pt qualifies for My Risk testing; letter sent 5/18  . Fibrocystic breast changes   . Heart murmur   . Hypertension   . Lichen sclerosus   . Wears contact lenses     Past Surgical History:  Procedure Laterality Date  . ABDOMINAL HYSTERECTOMY     partial  . BREAST BIOPSY Left    03/2014 negative  . BREAST BIOPSY Left    1992 negative  . BREAST CYST ASPIRATION Left    negative  . COLONOSCOPY WITH PROPOFOL N/A 02/09/2016   Procedure: COLONOSCOPY WITH PROPOFOL;  Surgeon: Darren Wohl, MD;  Location: [facility];  Service: Endoscopy;  Laterality: N/A;  . POLYPECTOMY  02/09/2016   Procedure: POLYPECTOMY;  Surgeon: Darren Wohl, MD;  Location: [facility];  Service: Endoscopy;;    Family History  Problem Relation Age of Onset  . Breast cancer Daughter 29  . Heart disease Father   . Hypertension Father   . Breast cancer Paternal Aunt 50    Social History   Socioeconomic History  . Marital status: Married    Spouse name: Not on file  . Number of children: Not on file  . Years of education: Not on file  . Highest education level: Not on file  Occupational History  . Not on file  Social Needs  .   Financial resource strain: Not on file  . Food insecurity:    Worry: Not on file    Inability: Not on file  . Transportation needs:    Medical: Not on file    Non-medical: Not on file  Tobacco Use  . Smoking status: Former Smoker    Last attempt to quit: 04/19/1980    Years since quitting: 37.4  . Smokeless tobacco: Never Used  Substance and Sexual Activity  . Alcohol use: Yes     Alcohol/week: 8.4 oz    Types: 14 Glasses of wine per week  . Drug use: No  . Sexual activity: Never  Lifestyle  . Physical activity:    Days per week: Not on file    Minutes per session: Not on file  . Stress: Not on file  Relationships  . Social connections:    Talks on phone: Not on file    Gets together: Not on file    Attends religious service: Not on file    Active member of club or organization: Not on file    Attends meetings of clubs or organizations: Not on file    Relationship status: Not on file  . Intimate partner violence:    Fear of current or ex partner: Not on file    Emotionally abused: Not on file    Physically abused: Not on file    Forced sexual activity: Not on file  Other Topics Concern  . Not on file  Social History Narrative  . Not on file    No Known Allergies  Outpatient Medications Prior to Visit  Medication Sig Dispense Refill  . aspirin EC 81 MG tablet Take 81 mg by mouth daily.    . Clobetasol Propionate 0.05 % lotion Apply to affected areas daily for one week when having symptoms. 59 mL 3  . Multiple Vitamins-Minerals (MULTIVITAMIN WITH MINERALS) tablet Take 1 tablet by mouth daily.    . lisinopril-hydrochlorothiazide (PRINZIDE,ZESTORETIC) 20-12.5 MG tablet TAKE 1 TABLET BY MOUTH EVERY DAY 90 tablet 0  . famciclovir (FAMVIR) 500 MG tablet Take 3 tablets (1,500 mg total) by mouth once. (Patient not taking: Reported on 08/23/2016) 6 tablet 0  . fluticasone (FLONASE) 50 MCG/ACT nasal spray Place 2 sprays into both nostrils daily. (Patient not taking: Reported on 03/25/2017) 16 g 0   No facility-administered medications prior to visit.     Review of Systems  Constitutional: Negative for chills, fever, malaise/fatigue and weight loss.  HENT: Negative for ear discharge, ear pain and sore throat.   Eyes: Negative for blurred vision.  Respiratory: Negative for cough, sputum production, shortness of breath and wheezing.   Cardiovascular: Negative for  chest pain, palpitations, orthopnea, leg swelling and PND.  Gastrointestinal: Negative for abdominal pain, blood in stool, constipation, diarrhea, heartburn, melena and nausea.  Genitourinary: Negative for dysuria, frequency, hematuria and urgency.  Musculoskeletal: Positive for joint pain. Negative for back pain, myalgias and neck pain.  Skin: Negative for rash.  Neurological: Negative for dizziness, tingling, sensory change, focal weakness, numbness and headaches.  Endo/Heme/Allergies: Negative for environmental allergies and polydipsia. Does not bruise/bleed easily.  Psychiatric/Behavioral: Negative for depression and suicidal ideas. The patient has insomnia. The patient is not nervous/anxious.      Objective  Vitals:   09/30/17 0904  BP: 120/70  Pulse: 60  Weight: 154 lb (69.9 kg)  Height: 5' 6" (1.676 m)    Physical Exam  Constitutional: No distress.  HENT:  Head: Normocephalic and atraumatic.  Right   Ear: Hearing, tympanic membrane and external ear normal.  Left Ear: Hearing, tympanic membrane and external ear normal.  Nose: Nose normal.  Mouth/Throat: Oropharynx is clear and moist. No oropharyngeal exudate, posterior oropharyngeal edema or posterior oropharyngeal erythema.  Eyes: Pupils are equal, round, and reactive to light. Conjunctivae and EOM are normal. Right eye exhibits no discharge. Left eye exhibits no discharge.  Neck: Normal range of motion. Neck supple. No JVD present. No thyromegaly present.  Cardiovascular: Normal rate, regular rhythm, S1 normal, S2 normal, normal heart sounds and intact distal pulses. PMI is not displaced. Exam reveals no gallop, no S3, no S4 and no friction rub.  No murmur heard.  No systolic murmur is present.  No diastolic murmur is present. Pulses:      Carotid pulses are 2+ on the right side, and 2+ on the left side.      Radial pulses are 2+ on the right side, and 2+ on the left side.       Femoral pulses are 2+ on the right side, and  2+ on the left side.      Popliteal pulses are 2+ on the right side, and 2+ on the left side.       Dorsalis pedis pulses are 2+ on the right side, and 2+ on the left side.       Posterior tibial pulses are 2+ on the right side, and 2+ on the left side.  Pulmonary/Chest: Effort normal and breath sounds normal. No stridor. She has no wheezes. She has no rales.  Abdominal: Soft. Bowel sounds are normal. She exhibits no mass. There is no tenderness. There is no guarding.  Musculoskeletal: Normal range of motion. She exhibits no edema.       Left hand: She exhibits tenderness. Normal sensation noted. Normal strength noted.  Tender left ulna collateral ligament/ FROM  Lymphadenopathy:    She has no cervical adenopathy.  Neurological: She is alert. She has normal reflexes.  Skin: Skin is warm and dry. She is not diaphoretic.  Nursing note and vitals reviewed.     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 Will start meloxicam and referral to orthopedics.    Relevant Medications   meloxicam (MOBIC) 15 MG tablet   Other Relevant Orders   DG Hand Complete Left (Completed)   Ambulatory referral to Orthopedic Surgery      Meds ordered this encounter  Medications  . lisinopril-hydrochlorothiazide (PRINZIDE,ZESTORETIC) 20-12.5 MG tablet    Sig: Take 1 tablet by mouth daily.    Dispense:  90 tablet     Refill:  1    sched appt  . DISCONTD: meloxicam (MOBIC) 15 MG tablet    Sig: Take 1 tablet (15 mg total) by mouth daily.    Dispense:  30 tablet    Refill:  0  . meloxicam (MOBIC) 15  MG tablet    Sig: Take 1 tablet (15 mg total) by mouth daily.    Dispense:  30 tablet    Refill:  0      Dr. Deanna Jones [facility]  09/30/17   

## 2017-10-01 LAB — RENAL FUNCTION PANEL
Albumin: 4.5 g/dL (ref 3.6–4.8)
BUN / CREAT RATIO: 24 (ref 12–28)
BUN: 19 mg/dL (ref 8–27)
CHLORIDE: 98 mmol/L (ref 96–106)
CO2: 26 mmol/L (ref 20–29)
Calcium: 9.3 mg/dL (ref 8.7–10.3)
Creatinine, Ser: 0.8 mg/dL (ref 0.57–1.00)
GFR calc non Af Amer: 79 mL/min/{1.73_m2} (ref >59)
GFR, EST AFRICAN AMERICAN: 91 mL/min/{1.73_m2} (ref >59)
GLUCOSE: 80 mg/dL (ref 65–99)
POTASSIUM: 4 mmol/L (ref 3.5–5.2)
Phosphorus: 3.9 mg/dL (ref 2.5–4.5)
SODIUM: 139 mmol/L (ref 134–144)

## 2017-10-01 LAB — LIPID PANEL
Chol/HDL Ratio: 4.6 ratio — ABNORMAL HIGH (ref 0.0–4.4)
Cholesterol, Total: 251 mg/dL — ABNORMAL HIGH (ref 100–199)
HDL: 54 mg/dL (ref >39)
LDL Calculated: 164 mg/dL — ABNORMAL HIGH (ref 0–99)
Triglycerides: 165 mg/dL — ABNORMAL HIGH (ref 0–149)
VLDL Cholesterol Cal: 33 mg/dL (ref 5–40)

## 2017-10-03 ENCOUNTER — Other Ambulatory Visit: Payer: Self-pay

## 2017-10-03 DIAGNOSIS — E782 Mixed hyperlipidemia: Secondary | ICD-10-CM

## 2017-10-03 MED ORDER — ATORVASTATIN CALCIUM 10 MG PO TABS: 10.0000 mg | ORAL_TABLET | Freq: Every day | ORAL | 1 refills | Status: DC

## 2017-10-25 IMAGING — MG MM DIGITAL SCREENING BILAT W/ TOMO W/ CAD
8 of 12 series · 8 of 28 positions shown · non-contrast
Comparison: Previous exam(s).

CLINICAL DATA: Screening.

EXAM:
2D DIGITAL SCREENING BILATERAL MAMMOGRAM WITH CAD AND ADJUNCT TOMO

[L CC]
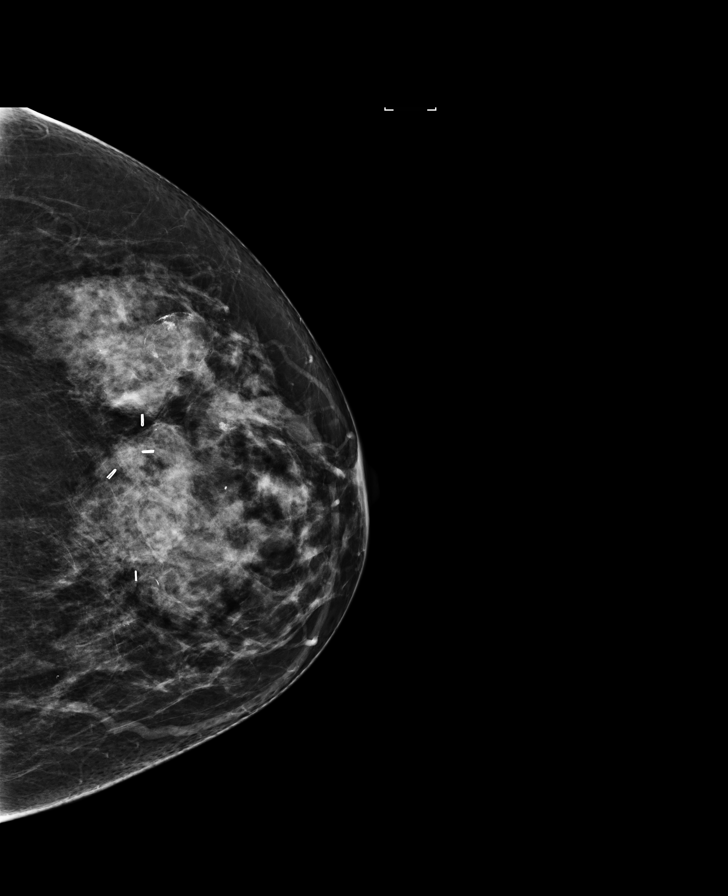

[R CC]
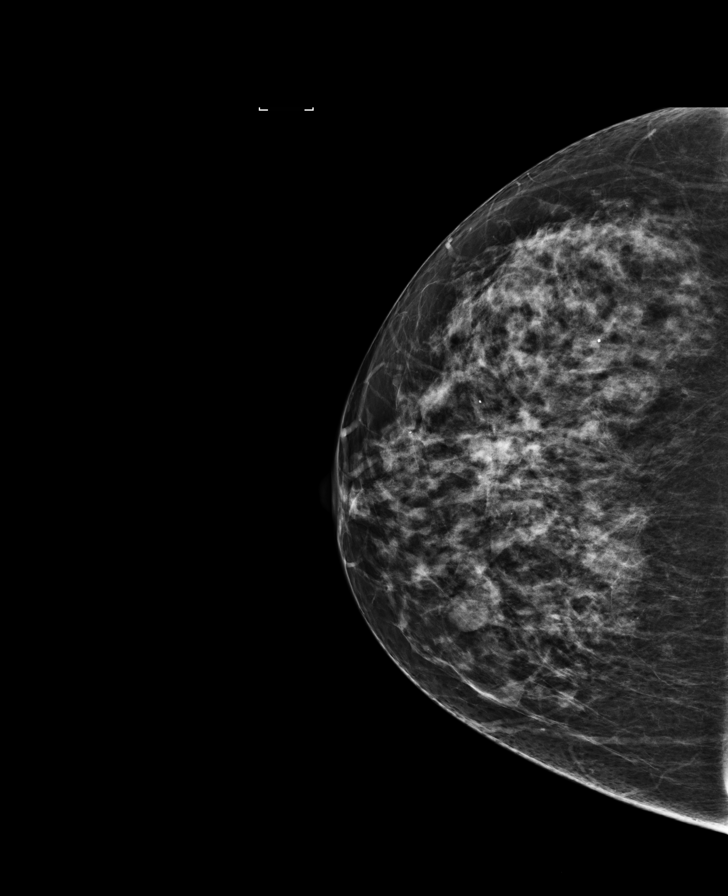

[R MLO synth-2D]
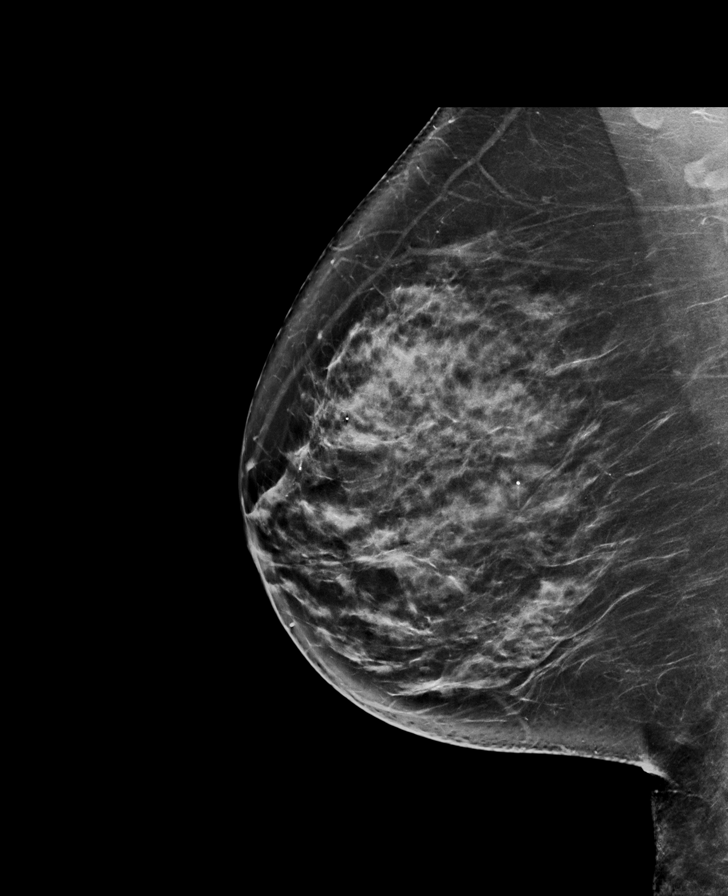

[R MLO]
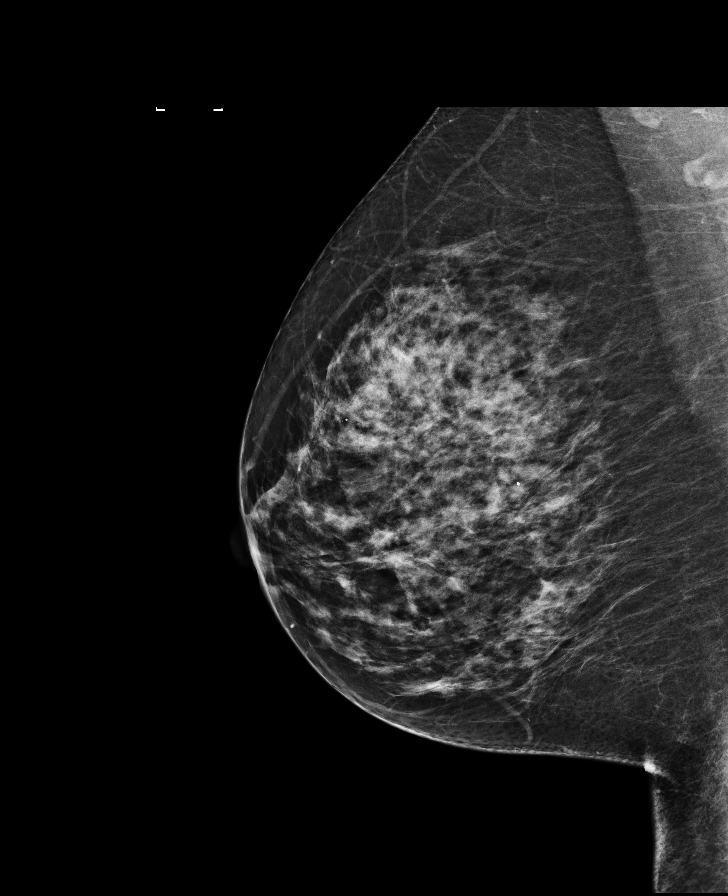

[L CC synth-2D]
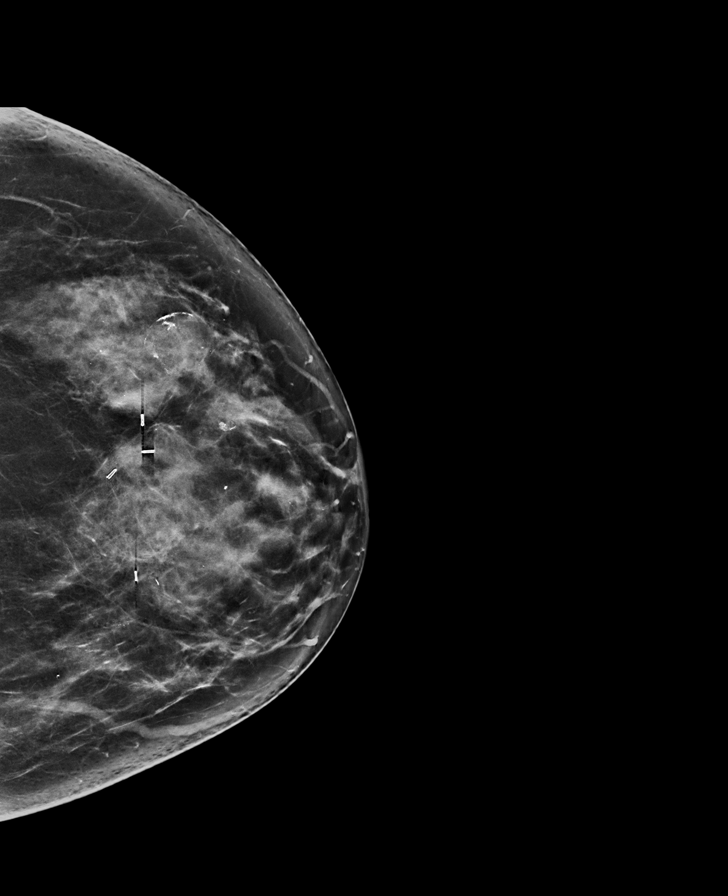

[R CC synth-2D]
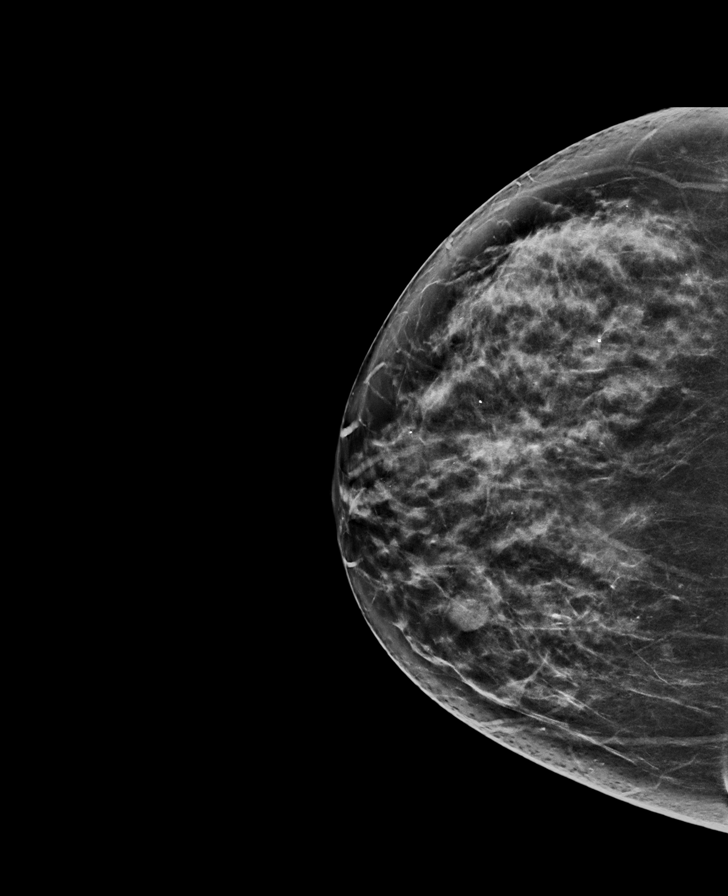

[L MLO]
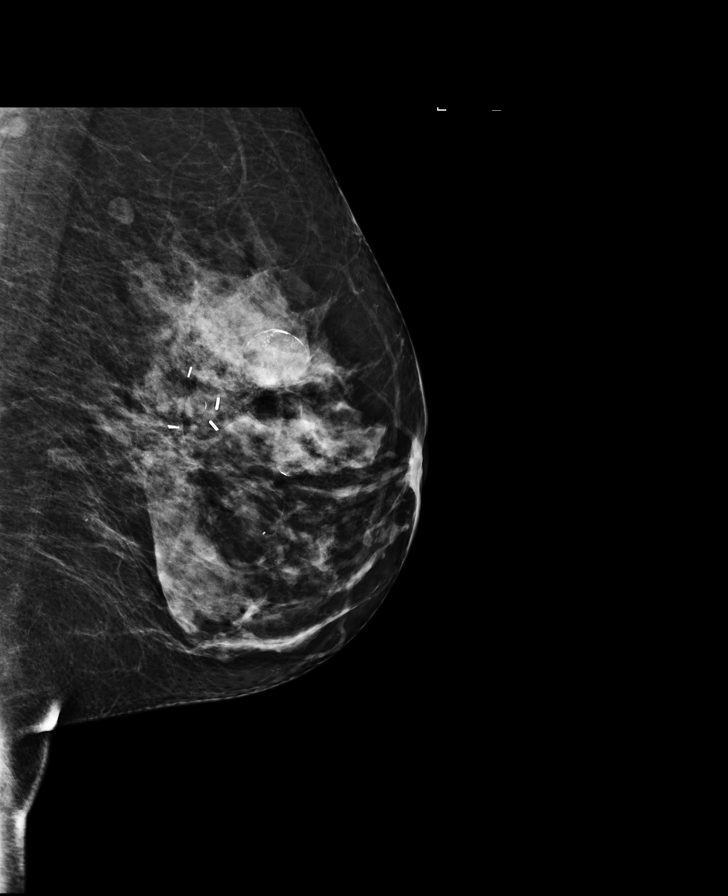

[L MLO synth-2D]
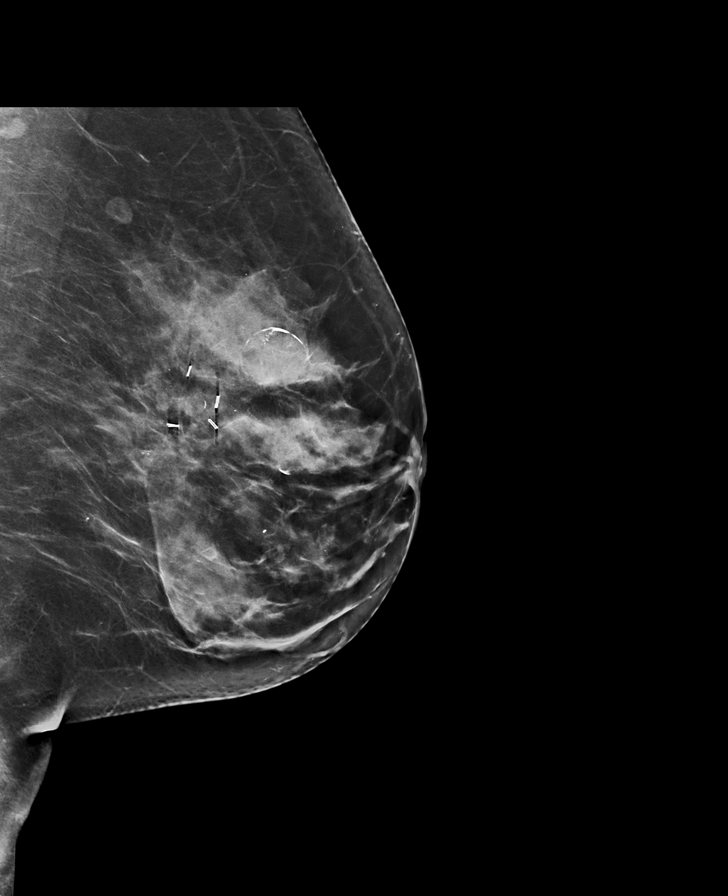

[8 of 28 positions shown; findings below may reference images not displayed]

ACR Breast Density Category c: The breast tissue is heterogeneously
dense, which may obscure small masses.
FINDINGS: There are no findings suspicious for malignancy. Images were
processed with CAD.
IMPRESSION: No mammographic evidence of malignancy. A result letter of this
screening mammogram will be mailed directly to the patient.

RECOMMENDATION:
Screening mammogram in one year. (Code:TN-[id number])

BI-RADS CATEGORY  1: Negative.

## 2017-11-18 ENCOUNTER — Other Ambulatory Visit: Payer: PRIVATE HEALTH INSURANCE

## 2017-11-18 DIAGNOSIS — E785 Hyperlipidemia, unspecified: Secondary | ICD-10-CM

## 2017-11-18 DIAGNOSIS — R69 Illness, unspecified: Secondary | ICD-10-CM

## 2017-11-19 LAB — HEPATIC FUNCTION PANEL
ALBUMIN: 4.3 g/dL (ref 3.6–4.8)
ALT: 47 IU/L — ABNORMAL HIGH (ref 0–32)
AST: 28 IU/L (ref 0–40)
Alkaline Phosphatase: 90 IU/L (ref 39–117)
BILIRUBIN TOTAL: 0.5 mg/dL (ref 0.0–1.2)
Bilirubin, Direct: 0.14 mg/dL (ref 0.00–0.40)
TOTAL PROTEIN: 6.9 g/dL (ref 6.0–8.5)

## 2017-11-19 LAB — LIPID PANEL WITH LDL/HDL RATIO
Cholesterol, Total: 187 mg/dL (ref 100–199)
HDL: 62 mg/dL (ref >39)
LDL CALC: 101 mg/dL — AB (ref 0–99)
LDl/HDL Ratio: 1.6 ratio (ref 0.0–3.2)
Triglycerides: 118 mg/dL (ref 0–149)
VLDL CHOLESTEROL CAL: 24 mg/dL (ref 5–40)

## 2017-11-23 ENCOUNTER — Other Ambulatory Visit: Payer: Self-pay

## 2017-11-23 DIAGNOSIS — E782 Mixed hyperlipidemia: Secondary | ICD-10-CM

## 2017-11-23 MED ORDER — ATORVASTATIN CALCIUM 10 MG PO TABS: 10.0000 mg | ORAL_TABLET | Freq: Every day | ORAL | 1 refills | Status: DC

## 2017-11-29 ENCOUNTER — Other Ambulatory Visit: Payer: Self-pay | Admitting: Family Medicine

## 2017-11-29 DIAGNOSIS — Z1231 Encounter for screening mammogram for malignant neoplasm of breast: Secondary | ICD-10-CM

## 2017-12-01 ENCOUNTER — Ambulatory Visit (INDEPENDENT_AMBULATORY_CARE_PROVIDER_SITE_OTHER): Payer: PRIVATE HEALTH INSURANCE | Admitting: Family Medicine

## 2017-12-01 ENCOUNTER — Encounter: Payer: Self-pay | Admitting: Family Medicine

## 2017-12-01 ENCOUNTER — Ambulatory Visit
Admission: RE | Admit: 2017-12-01 | Discharge: 2017-12-01 | Disposition: A | Discharge status: Home / Self Care | Payer: 59 | Source: Ambulatory Visit | Attending: Family Medicine | Admitting: Family Medicine

## 2017-12-01 VITALS — BP 122/81 | HR 81 | Resp 16 | Ht 66.0 in | Wt 157.4 lb

## 2017-12-01 DIAGNOSIS — S8012XA Contusion of left lower leg, initial encounter: Secondary | ICD-10-CM

## 2017-12-01 DIAGNOSIS — S8011XA Contusion of right lower leg, initial encounter: Secondary | ICD-10-CM

## 2017-12-01 DIAGNOSIS — M1711 Unilateral primary osteoarthritis, right knee: Secondary | ICD-10-CM | POA: Diagnosis not present

## 2017-12-01 DIAGNOSIS — S80219A Abrasion, unspecified knee, initial encounter: Secondary | ICD-10-CM | POA: Diagnosis not present

## 2017-12-01 DIAGNOSIS — W19XXXA Unspecified fall, initial encounter: Secondary | ICD-10-CM | POA: Diagnosis not present

## 2017-12-01 MED ORDER — MUPIROCIN 2 % EX OINT: 1.0000 "application " | TOPICAL_OINTMENT | Freq: Two times a day (BID) | CUTANEOUS | 0 refills | Status: DC

## 2017-12-01 NOTE — Patient Instructions (Signed)
Abrasion An abrasion is a cut or scrape on the outer surface of your skin. An abrasion does not extend through all of the layers of your skin. It is important to care for your abrasion properly to prevent infection. What are the causes? Most abrasions are caused by falling on or gliding across the ground or another surface. When your skin rubs on something, the outer and inner layer of skin rubs off. What are the signs or symptoms? A cut or scrape is the main symptom of this condition. The scrape may be bleeding, or it may appear red or pink. If there was an associated fall, there may be an underlying bruise. How is this diagnosed? An abrasion is diagnosed with a physical exam. How is this treated? Treatment for this condition depends on how large and deep the abrasion is. Usually, your abrasion will be cleaned with water and mild soap. This removes any dirt or debris that may be stuck. An antibiotic ointment may be applied to the abrasion to help prevent infection. A bandage (dressing) may be placed on the abrasion to keep it clean. You may also need a tetanus shot. Follow these instructions at home: Medicines   Take or apply medicines only as directed by your health care provider.  If you were prescribed an antibiotic ointment, finish all of it even if you start to feel better. Wound care   Clean the wound with mild soap and water 2-3 times per day or as directed by your health care provider. Pat your wound dry with a clean towel. Do not rub it.  There are many different ways to close and cover a wound. Follow instructions from your health care provider about:  Wound care.  Dressing changes and removal.  Check your wound every day for signs of infection. Watch for:  Redness, swelling, or pain.  Fluid, blood, or pus. General instructions    Keep the dressing dry as directed by your health care provider. Do not take baths, swim, use a hot tub, or do anything that would put your  wound underwater until your health care provider approves.  If there is swelling, raise (elevate) the injured area above the level of your heart while you are sitting or lying down.  Keep all follow-up visits as directed by your health care provider. This is important. Contact a health care provider if:  You received a tetanus shot and you have swelling, severe pain, redness, or bleeding at the injection site.  Your pain is not controlled with medicine.  You have increased redness, swelling, or pain at the site of your wound. Get help right away if:  You have a red streak going away from your wound.  You have a fever.  You have fluid, blood, or pus coming from your wound.  You notice a bad smell coming from your wound or your dressing. This information is not intended to replace advice given to you by your health care provider. Make sure you discuss any questions you have with your health care provider. Document Released: 01/13/2005 Document Revised: 12/05/2015 Document Reviewed: 04/03/2014 Elsevier Interactive Patient Education  2017 [employer]  

## 2017-12-01 NOTE — Progress Notes (Signed)
No degenerative changes/fracture. mychart and LM

## 2017-12-01 NOTE — Progress Notes (Signed)
Name: Ebony Harris   MRN: [id number]    DOB: September 18, 1955   Date:12/01/2017       Progress Note  Subjective  Chief Complaint  Chief Complaint  Patient presents with  . Knee Injury    L and R knee abraisons from fall X4 days ago   . Toe Injury    L foot Great Toe abraison after fall   . Bleeding/Bruising    wants to stop  aspirin if ok- has had no more chest episodes since starting [employer].     Fall  The accident occurred 3 to 5 days ago. The fall occurred while walking (tripped by speed bump). She landed on concrete (black top). There was no blood loss. The point of impact was the right knee and left knee. The pain is present in the left knee and right knee. The pain is at a severity of 4/10. The pain is moderate. Pertinent negatives include no abdominal pain, bowel incontinence, fever, headaches, hearing loss, hematuria, loss of consciousness, nausea, numbness or tingling. She has tried ice and NSAID for the symptoms. The treatment provided mild relief.    No problem-specific Assessment & Plan notes found for this encounter.   Past Medical History:  Diagnosis Date  . Family history of breast cancer    daughter was BRCA neg; pt qualifies for My Risk testing; letter sent 5/18  . Fibrocystic breast changes   . Heart murmur   . Hypertension   . Lichen sclerosus   . Wears contact lenses     Past Surgical History:  Procedure Laterality Date  . ABDOMINAL HYSTERECTOMY     partial  . BREAST BIOPSY Left    03/2014 negative  . BREAST BIOPSY Left    1992 negative  . BREAST CYST ASPIRATION Left    negative  . COLONOSCOPY WITH PROPOFOL N/A 02/09/2016   Procedure: COLONOSCOPY WITH PROPOFOL;  Surgeon: Lucilla Lame, MD;  Location: [facility];  Service: Endoscopy;  Laterality: N/A;  . POLYPECTOMY  02/09/2016   Procedure: POLYPECTOMY;  Surgeon: Lucilla Lame, MD;  Location: [place];  Service: Endoscopy;;    Family History  Problem Relation Age of Onset  . Breast cancer  Daughter 42  . Heart disease Father   . Hypertension Father   . Breast cancer Paternal Aunt 23    Social History   Socioeconomic History  . Marital status: Married    Spouse name: Not on file  . Number of children: Not on file  . Years of education: Not on file  . Highest education level: Not on file  Occupational History  . Not on file  Social Needs  . Financial resource strain: Not on file  . Food insecurity:    Worry: Not on file    Inability: Not on file  . Transportation needs:    Medical: Not on file    Non-medical: Not on file  Tobacco Use  . Smoking status: Former Smoker    Last attempt to quit: 04/19/1980    Years since quitting: 37.6  . Smokeless tobacco: Never Used  Substance and Sexual Activity  . Alcohol use: Yes    Alcohol/week: 14.0 standard drinks    Types: 14 Glasses of wine per week  . Drug use: No  . Sexual activity: Never  Lifestyle  . Physical activity:    Days per week: Not on file    Minutes per session: Not on file  . Stress: Not on file  Relationships  .  Social connections:    Talks on phone: Not on file    Gets together: Not on file    Attends religious service: Not on file    Active member of club or organization: Not on file    Attends meetings of clubs or organizations: Not on file    Relationship status: Not on file  . Intimate partner violence:    Fear of current or ex partner: Not on file    Emotionally abused: Not on file    Physically abused: Not on file    Forced sexual activity: Not on file  Other Topics Concern  . Not on file  Social History Narrative  . Not on file    No Known Allergies  Outpatient Medications Prior to Visit  Medication Sig Dispense Refill  . aspirin EC 81 MG tablet Take 81 mg by mouth daily.    Marland Kitchen atorvastatin (LIPITOR) 10 MG tablet Take 1 tablet (10 mg total) by mouth daily. 90 tablet 1  . Clobetasol Propionate 0.05 % lotion Apply to affected areas daily for one week when having symptoms. 59 mL 3   . lisinopril-hydrochlorothiazide (PRINZIDE,ZESTORETIC) 20-12.5 MG tablet Take 1 tablet by mouth daily. 90 tablet 1  . Multiple Vitamins-Minerals (MULTIVITAMIN WITH MINERALS) tablet Take 1 tablet by mouth daily.    . fluticasone (FLONASE) 50 MCG/ACT nasal spray Place 2 sprays into both nostrils daily. 16 g 0  . famciclovir (FAMVIR) 500 MG tablet Take 3 tablets (1,500 mg total) by mouth once. (Patient not taking: Reported on 08/23/2016) 6 tablet 0  . meloxicam (MOBIC) 15 MG tablet Take 1 tablet (15 mg total) by mouth daily. 30 tablet 0   No facility-administered medications prior to visit.     Review of Systems  Constitutional: Negative for chills, fever, malaise/fatigue and weight loss.  HENT: Negative for ear discharge, ear pain and sore throat.   Eyes: Negative for blurred vision.  Respiratory: Negative for cough, sputum production, shortness of breath and wheezing.   Cardiovascular: Negative for chest pain, palpitations and leg swelling.  Gastrointestinal: Negative for abdominal pain, blood in stool, bowel incontinence, constipation, diarrhea, heartburn, melena and nausea.  Genitourinary: Negative for dysuria, frequency, hematuria and urgency.  Musculoskeletal: Negative for back pain, joint pain, myalgias and neck pain.  Skin: Negative for rash.  Neurological: Negative for dizziness, tingling, sensory change, focal weakness, loss of consciousness, numbness and headaches.  Endo/Heme/Allergies: Negative for environmental allergies and polydipsia. Does not bruise/bleed easily.  Psychiatric/Behavioral: Negative for depression and suicidal ideas. The patient is not nervous/anxious and does not have insomnia.      Objective  Vitals:   12/01/17 0819  BP: 122/81  Pulse: 81  Resp: 16  SpO2: 96%  Weight: 157 lb 6.4 oz (71.4 kg)  Height: 5' 6" (1.676 m)    Physical Exam  Constitutional: She is oriented to person, place, and time. She appears well-developed and well-nourished.  HENT:   Head: Normocephalic.  Right Ear: External ear normal.  Left Ear: External ear normal.  Mouth/Throat: Oropharynx is clear and moist.  Eyes: Pupils are equal, round, and reactive to light. Conjunctivae and EOM are normal. Lids are everted and swept, no foreign bodies found. Left eye exhibits no hordeolum. No foreign body present in the left eye. Right conjunctiva is not injected. Left conjunctiva is not injected. No scleral icterus.  Neck: Normal range of motion. Neck supple. No JVD present. No tracheal deviation present. No thyromegaly present.  Cardiovascular: Normal rate, regular rhythm, normal  heart sounds and intact distal pulses. Exam reveals no gallop and no friction rub.  No murmur heard. Pulmonary/Chest: Effort normal and breath sounds normal. No respiratory distress. She has no wheezes. She has no rales.  Abdominal: Soft. Bowel sounds are normal. She exhibits no mass. There is no hepatosplenomegaly. There is no tenderness. There is no rebound and no guarding.  Musculoskeletal: Normal range of motion. She exhibits no edema.       Right knee: Tenderness found. Patellar tendon tenderness noted. No medial joint line, no lateral joint line, no MCL and no LCL tenderness noted.       Left knee: Tenderness found. Patellar tendon tenderness noted. No medial joint line, no lateral joint line, no MCL and no LCL tenderness noted.  Lymphadenopathy:    She has no cervical adenopathy.  Neurological: She is alert and oriented to person, place, and time. She has normal strength. She displays normal reflexes. No cranial nerve deficit.  Skin: Skin is warm. No rash noted.  Psychiatric: She has a normal mood and affect. Her mood appears not anxious. She does not exhibit a depressed mood.  Nursing note and vitals reviewed.     Assessment & Plan  Problem List Items Addressed This Visit    None    Visit Diagnoses    Contusion of both tibias    -  Primary   S/P fall /directly upon the knees. Pain of  both proximal tibias. [place] because of history of plateau fracture..    Relevant Orders   DG Knee Complete 4 Views Right   DG Knee Complete 4 Views Left   Abrasion of knee, unspecified laterality, initial encounter       Bilalateral abrasions with partial healing. clean and dressed with triple antibiotic . Bactroban called in .       Meds ordered this encounter  Medications  . mupirocin ointment (BACTROBAN) 2 %    Sig: Apply 1 application topically 2 (two) times daily.    Dispense:  22 g    Refill:  0      Dr. [facility]  12/01/17

## 2017-12-15 ENCOUNTER — Encounter: Payer: Self-pay | Admitting: Family Medicine

## 2017-12-15 ENCOUNTER — Ambulatory Visit (INDEPENDENT_AMBULATORY_CARE_PROVIDER_SITE_OTHER): Payer: PRIVATE HEALTH INSURANCE | Admitting: Family Medicine

## 2017-12-15 VITALS — BP 120/80 | HR 76 | Ht 66.0 in | Wt 156.0 lb

## 2017-12-15 DIAGNOSIS — F418 Other specified anxiety disorders: Secondary | ICD-10-CM

## 2017-12-15 MED ORDER — ALPRAZOLAM 0.25 MG PO TABS: 0.2500 mg | ORAL_TABLET | Freq: Two times a day (BID) | ORAL | 0 refills | Status: DC | PRN

## 2017-12-15 NOTE — Progress Notes (Signed)
Name: Ebony Harris   MRN: [id number]    DOB: 10-Aug-1955   Date:12/15/2017       Progress Note  Subjective  Chief Complaint  Chief Complaint  Patient presents with  . Anxiety    going on trip to [place]- needs 1 week of anxiety med    Anxiety  Presents for initial visit. Episode onset: relatively recent. The problem has been waxing and waning. Symptoms include excessive worry, insomnia and nervous/anxious behavior. Patient reports no chest pain, compulsions, confusion, decreased concentration, depressed mood, dizziness, dry mouth, feeling of choking, hyperventilation, impotence, irritability, malaise, muscle tension, nausea, obsessions, palpitations, panic, restlessness, shortness of breath or suicidal ideas. Symptoms occur occasionally. The severity of symptoms is moderate. The quality of sleep is good.   Risk factors: recent travel. Her past medical history is significant for anxiety/panic attacks. There is no history of anemia, arrhythmia, asthma, bipolar disorder, CAD, CHF, chronic lung disease, depression, fibromyalgia, hyperthyroidism or suicide attempts. Past treatments include benzodiazephines.    No problem-specific Assessment & Plan notes found for this encounter.   Past Medical History:  Diagnosis Date  . Family history of breast cancer    daughter was BRCA neg; pt qualifies for My Risk testing; letter sent 5/18  . Fibrocystic breast changes   . Heart murmur   . Hypertension   . Lichen sclerosus   . Wears contact lenses     Past Surgical History:  Procedure Laterality Date  . ABDOMINAL HYSTERECTOMY     partial  . BREAST BIOPSY Left    03/2014 negative  . BREAST BIOPSY Left    1992 negative  . BREAST CYST ASPIRATION Left    negative  . COLONOSCOPY WITH PROPOFOL N/A 02/09/2016   Procedure: COLONOSCOPY WITH PROPOFOL;  Surgeon: Lucilla Lame, MD;  Location: [facility];  Service: Endoscopy;  Laterality: N/A;  . POLYPECTOMY  02/09/2016   Procedure: POLYPECTOMY;   Surgeon: Lucilla Lame, MD;  Location: [facility];  Service: Endoscopy;;    Family History  Problem Relation Age of Onset  . Breast cancer Daughter 70  . Heart disease Father   . Hypertension Father   . Breast cancer Paternal Aunt 60    Social History   Socioeconomic History  . Marital status: Married    Spouse name: Not on file  . Number of children: Not on file  . Years of education: Not on file  . Highest education level: Not on file  Occupational History  . Not on file  Social Needs  . Financial resource strain: Not on file  . Food insecurity:    Worry: Not on file    Inability: Not on file  . Transportation needs:    Medical: Not on file    Non-medical: Not on file  Tobacco Use  . Smoking status: Former Smoker    Last attempt to quit: 04/19/1980    Years since quitting: 37.6  . Smokeless tobacco: Never Used  Substance and Sexual Activity  . Alcohol use: Yes    Alcohol/week: 14.0 standard drinks    Types: 14 Glasses of wine per week  . Drug use: No  . Sexual activity: Never  Lifestyle  . Physical activity:    Days per week: Not on file    Minutes per session: Not on file  . Stress: Not on file  Relationships  . Social connections:    Talks on phone: Not on file    Gets together: Not on file    Attends  religious service: Not on file    Active member of club or organization: Not on file    Attends meetings of clubs or organizations: Not on file    Relationship status: Not on file  . Intimate partner violence:    Fear of current or ex partner: Not on file    Emotionally abused: Not on file    Physically abused: Not on file    Forced sexual activity: Not on file  Other Topics Concern  . Not on file  Social History Narrative  . Not on file    No Known Allergies  Outpatient Medications Prior to Visit  Medication Sig Dispense Refill  . aspirin EC 81 MG tablet Take 81 mg by mouth daily.    Marland Kitchen atorvastatin (LIPITOR) 10 MG tablet Take 1 tablet (10 mg  total) by mouth daily. 90 tablet 1  . Clobetasol Propionate 0.05 % lotion Apply to affected areas daily for one week when having symptoms. 59 mL 3  . lisinopril-hydrochlorothiazide (PRINZIDE,ZESTORETIC) 20-12.5 MG tablet Take 1 tablet by mouth daily. 90 tablet 1  . Multiple Vitamins-Minerals (MULTIVITAMIN WITH MINERALS) tablet Take 1 tablet by mouth daily.    . mupirocin ointment (BACTROBAN) 2 % Apply 1 application topically 2 (two) times daily. 22 g 0   No facility-administered medications prior to visit.     Review of Systems  Constitutional: Negative for chills, fever, irritability, malaise/fatigue and weight loss.  HENT: Negative for ear discharge, ear pain and sore throat.   Eyes: Negative for blurred vision.  Respiratory: Negative for cough, sputum production, shortness of breath and wheezing.   Cardiovascular: Negative for chest pain, palpitations and leg swelling.  Gastrointestinal: Negative for abdominal pain, blood in stool, constipation, diarrhea, heartburn, melena and nausea.  Genitourinary: Negative for dysuria, frequency, hematuria, impotence and urgency.  Musculoskeletal: Negative for back pain, joint pain, myalgias and neck pain.  Skin: Negative for rash.  Neurological: Negative for dizziness, tingling, sensory change, focal weakness and headaches.  Endo/Heme/Allergies: Negative for environmental allergies and polydipsia. Does not bruise/bleed easily.  Psychiatric/Behavioral: Negative for confusion, decreased concentration, depression and suicidal ideas. The patient is nervous/anxious and has insomnia.      Objective  Vitals:   12/15/17 0833  BP: 120/80  Pulse: 76  Weight: 156 lb (70.8 kg)  Height: '5\' 6"'  (1.676 m)    Physical Exam  Constitutional: She is oriented to person, place, and time. She appears well-developed and well-nourished.  HENT:  Head: Normocephalic.  Right Ear: External ear normal.  Left Ear: External ear normal.  Mouth/Throat: Oropharynx is  clear and moist.  Eyes: Pupils are equal, round, and reactive to light. Conjunctivae and EOM are normal. Lids are everted and swept, no foreign bodies found. Left eye exhibits no hordeolum. No foreign body present in the left eye. Right conjunctiva is not injected. Left conjunctiva is not injected. No scleral icterus.  Neck: Normal range of motion. Neck supple. No JVD present. No tracheal deviation present. No thyromegaly present.  Cardiovascular: Normal rate, regular rhythm, normal heart sounds and intact distal pulses. Exam reveals no gallop and no friction rub.  No murmur heard. Pulmonary/Chest: Effort normal and breath sounds normal. No respiratory distress. She has no wheezes. She has no rales.  Abdominal: Soft. Bowel sounds are normal. She exhibits no mass. There is no hepatosplenomegaly. There is no tenderness. There is no rebound and no guarding.  Musculoskeletal: Normal range of motion. She exhibits no edema or tenderness.  Lymphadenopathy:  She has no cervical adenopathy.  Neurological: She is alert and oriented to person, place, and time. She has normal strength. She displays normal reflexes. No cranial nerve deficit.  Skin: Skin is warm. No rash noted.  Psychiatric: She has a normal mood and affect. Her mood appears not anxious. She does not exhibit a depressed mood.  Nursing note and vitals reviewed.     Assessment & Plan  Problem List Items Addressed This Visit    None    Visit Diagnoses    Situational anxiety    -  Primary   Recent onset with upcoming travel anxiety. Will initiate alprazolam 0.25 mg bid prn travel anxiety..   Relevant Medications   ALPRAZolam (XANAX) 0.25 MG tablet      Meds ordered this encounter  Medications  . ALPRAZolam (XANAX) 0.25 MG tablet    Sig: Take 1 tablet (0.25 mg total) by mouth 2 (two) times daily as needed for anxiety.    Dispense:  30 tablet    Refill:  0      Dr. Dicky Boer [facility]  12/15/17

## 2018-01-04 ENCOUNTER — Ambulatory Visit
Admission: RE | Admit: 2018-01-04 | Discharge: 2018-01-04 | Disposition: A | Discharge status: Home / Self Care | Payer: 59 | Source: Ambulatory Visit | Attending: Family Medicine | Admitting: Family Medicine

## 2018-01-04 DIAGNOSIS — Z1231 Encounter for screening mammogram for malignant neoplasm of breast: Secondary | ICD-10-CM | POA: Diagnosis not present

## 2018-01-05 ENCOUNTER — Other Ambulatory Visit: Payer: Self-pay | Admitting: Family Medicine

## 2018-01-05 DIAGNOSIS — R928 Other abnormal and inconclusive findings on diagnostic imaging of breast: Secondary | ICD-10-CM

## 2018-01-05 DIAGNOSIS — N6489 Other specified disorders of breast: Secondary | ICD-10-CM

## 2018-01-11 ENCOUNTER — Ambulatory Visit
Admission: RE | Admit: 2018-01-11 | Discharge: 2018-01-11 | Disposition: A | Discharge status: Home / Self Care | Payer: 59 | Source: Ambulatory Visit | Attending: Family Medicine | Admitting: Family Medicine

## 2018-01-11 ENCOUNTER — Ambulatory Visit (INDEPENDENT_AMBULATORY_CARE_PROVIDER_SITE_OTHER): Payer: 59 | Admitting: Obstetrics & Gynecology

## 2018-01-11 ENCOUNTER — Encounter: Payer: Self-pay | Admitting: Obstetrics & Gynecology

## 2018-01-11 VITALS — BP 140/80 | Ht 66.0 in | Wt 156.0 lb

## 2018-01-11 DIAGNOSIS — Z1211 Encounter for screening for malignant neoplasm of colon: Secondary | ICD-10-CM

## 2018-01-11 DIAGNOSIS — L900 Lichen sclerosus et atrophicus: Secondary | ICD-10-CM

## 2018-01-11 DIAGNOSIS — Z0000 Encounter for general adult medical examination without abnormal findings: Secondary | ICD-10-CM

## 2018-01-11 DIAGNOSIS — N6489 Other specified disorders of breast: Secondary | ICD-10-CM | POA: Insufficient documentation

## 2018-01-11 DIAGNOSIS — R928 Other abnormal and inconclusive findings on diagnostic imaging of breast: Secondary | ICD-10-CM

## 2018-01-11 MED ORDER — CLOBETASOL PROPIONATE 0.05 % EX LOTN: TOPICAL_LOTION | CUTANEOUS | 3 refills | Status: DC

## 2018-01-11 NOTE — Patient Instructions (Addendum)
PAP every three years, due 2020 Mammogram every year Colonoscopy every 10 years, due 2027 Labs yearly (with PCP)  Multigene Panel Testing for Cancer What is cancer? Normal cells in the body grow, divide, and are replaced on a routine basis. Sometimes, cells divide abnormally and begin to grow out of control. These cells may form growths or tumors. Tumors can be benign (not cancer) or malignant (cancer). Benign tumors do not spread to other body tissues. Cancer tumors can invade and destroy nearby healthy tissues, bones, and organs. Cancer cells also can spread to other parts of the body and form new cancerous areas.  What causes cancer? Cancer is caused by many different factors. A few types of cancer are caused by changes in genes that can be passed from parent to child. Changes in genes are called mutations. Certain gene mutations are associated with family cancer syndromes. What are family cancer syndromes?  Family cancer syndromes are genetic conditions that increase the risk of certain types of cancer. They also are called hereditary or inherited cancer syndromes. Common family cancer syndromes include hereditary breast and ovarian cancer (HBOC) syndrome, Lynch syndrome, Li-Fraumeni syndrome, Cowden syndrome, and Peutz-Jeghers syndrome. What is genetic testing for cancer? Genetic testing for cancer looks for mutations in certain genes that are known to be linked to cancer. The results can help determine your risk of developing a disease like cancer or passing on a genetic disorder. What is multigene panel testing? Multigene panel testing is a type of genetic testing that looks for mutations in several genes at once. This is different from single-gene testing, which looks for a mutation in a specific gene. Single-gene testing is often used when there is already a known gene mutation in a family. For example, testing for BRCA mutations only looks for changes in BRCA1 and BRCA2 genes. Who should  have genetic testing? You may consider genetic testing if your personal or family history shows that you have an increased risk of cancer. Your obstetrician-gynecologist (ob-gyn) or other health care professional may ask you these and other questions: . Have you or any family members been diagnosed with cancer?  . If yes, which family members were diagnosed, with what types of cancer, and at what ages?  . Were you or any of your family members born with birth defects?  Ebony Harris Are you of [place] or [place] Jewish ancestry? Depending on your answers, your ob-gyn or other health care professional may suggest that you talk about genetic testing with a genetic counselor or a physician who is an expert in genetics. You can choose to have genetic testing, or you can choose not to. Before you decide, you should have genetic counseling. What is genetic counseling? In genetic counseling, you will talk with a genetic counselor or physician expert about the following: . Your risk of getting a hereditary type of cancer  . Who in your family could potentially get tested  . How testing is done  . What the test results may mean  . What you may do depending on the results How is genetic testing done? Genetic testing typically is done from a blood sample or saliva sample. When is multigene panel testing recommended? Multigene panel testing may be useful if you . are at risk of a family cancer syndrome that has more than one gene associated with it  . have a personal or family history of cancer and single-gene testing has not found a mutation, or the result is uncertain What are  the benefits of multigene panel testing? Multigene panel testing looks at multiple genes with one test. If a gene mutation is found, multigene panel testing may . give you a better understanding of your cancer risk than single-gene testing  . help your health care team decide what cancer screenings you might need beyond routine  screenings  . help you think about what you can do to prevent cancer What are the risks of multigene panel testing? The risks of multigene panel testing may include the following: . Results can be complicated to interpret.  . Testing may find gene mutations that show a moderate or uncertain risk of cancer.  . It may be hard to know what you should do with your test results. You should talk with a genetic counselor or physician expert before and after genetic testing to learn what the results mean. If I have a gene mutation, should I tell my family? Having a gene mutation means you can pass the mutation to your children. Your siblings also may have the gene mutation. Although you do not have to tell your family members, sharing the information could be life-saving for them. With this information, your family members can decide whether to be tested and get cancer screenings at an early age. How can I prevent cancer if I test positive for a gene mutation? If you test positive for a gene mutation, you can discuss cancer screening and prevention options with your ob-gyn, genetic counselor, or other health care professional. It may be helpful to have earlier or more frequent cancer screening tests, which can find cancer at an early and more curable stage. Risk reduction steps like medication, surgery, and lifestyle changes also may be recommended. I'm concerned about discrimination based on genetic testing results. What should I know? Many people are concerned about possible employment discrimination or denial of insurance coverage based on genetic testing results. The Genetic Information Nondiscrimination Act of 2008 (GINA) makes it illegal for health insurers to require genetic testing results or use results to make decisions about coverage, rates, or preexisting conditions. GINA also makes it illegal for employers to discriminate against employees or applicants because of genetic information. GINA does not  apply to life insurance, [place]-term care insurance, or disability insurance. What should I know about direct-to-consumer genetic tests? Direct-to-consumer (or at-home) genetic tests are sold over the internet. You do not need a doctor's order to get one. The [employer] discourages use of direct-to-consumer genetic tests because the results may be misleading. For example, one commercial test for BRCA mutations only looks for three mutations, even though there are more than 500 BRCA mutations linked to cancer. The test results could cause unnecessary fear, or a false sense that you are not at risk. You should see a health care professional if you want a genetic test.  Glossary BRCA1 and BRCA2: Genes that keep cells from growing too rapidly. Changes in these genes have been linked to an increased risk of breast cancer and ovarian cancer.  Cowden Syndrome: A genetic condition that increases a person's risk of cancer of the breast, thyroid, uterus, colon, kidney, and skin. Genes: Segments of DNA that contain instructions for the development of a person's physical traits and control of the processes in the body. The gene is the basic unit of heredity and can be passed from parent to child. Genetic Counselor: A health care professional with special training in genetics who can provide expert advice about genetic disorders and  prenatal testing. Hereditary Breast and Ovarian Cancer (HBOC) Syndrome: A genetic condition that increases a person's risk of cancer of the breast, ovary, prostate, pancreas, and skin (melanoma). Li-Fraumeni Syndrome: A genetic condition that increases a person's risk of cancer of the breast, bones, soft tissue, brain, and outer layer of the adrenal glands. Lynch Syndrome: A genetic condition that increases a person's risk of cancer of the colon, rectum, ovary, uterus, pancreas, and bile duct. Multigene Panel Testing: A type of genetic test that can  look for mutations in multiple genes at once. Mutations: Changes in genes that can be passed from parent to child. Obstetrician-Gynecologist (Ob-Gyn): A doctor with special training and education in women's health. Peutz-Jeghers Syndrome: A genetic condition that increases a person's risk of cancer of the stomach, intestines, pancreas, cervix, ovary, and breast.

## 2018-01-11 NOTE — Progress Notes (Signed)
HPI:      Ebony Harris is a 62 y.o. [phone] who LMP was in the past, she presents today for her annual examination.  The patient has no complaints today. The patient is sexually active. Herlast pap: approximate date 2017 and was normal and last mammogram: approximate date 2019 and was abnormal: she is coming for additional right breast images later today.  The patient does perform self breast exams.  There is notable family history of breast or ovarian cancer in her family. The patient is not taking hormone replacement therapy. Patient denies post-menopausal vaginal bleeding.   The patient has regular exercise: yes. The patient denies current symptoms of depression.  LS: has occas flare up and medicine helps (uses for a week then stops).  GYN Hx: Last Colonoscopy:1 year ago. Normal.  Last DEXA: never ago.    PMHx: Past Medical History:  Diagnosis Date  . Family history of breast cancer    daughter was BRCA neg; pt qualifies for My Risk testing; letter sent 5/18  . Fibrocystic breast changes   . Heart murmur   . Hypertension   . Lichen sclerosus   . Wears contact lenses    Past Surgical History:  Procedure Laterality Date  . ABDOMINAL HYSTERECTOMY     partial  . BREAST BIOPSY Left    03/2014 negative  . BREAST BIOPSY Left    1992 negative  . BREAST CYST ASPIRATION Left    negative  . COLONOSCOPY WITH PROPOFOL N/A 02/09/2016   Procedure: COLONOSCOPY WITH PROPOFOL;  Surgeon: Lucilla Lame, MD;  Location: [place];  Service: Endoscopy;  Laterality: N/A;  . POLYPECTOMY  02/09/2016   Procedure: POLYPECTOMY;  Surgeon: Lucilla Lame, MD;  Location: [place];  Service: Endoscopy;;   Family History  Problem Relation Age of Onset  . Breast cancer Daughter 33  . Heart disease Father   . Hypertension Father   . Breast cancer Paternal Aunt 42  . Breast cancer Other    Social History   Tobacco Use  . Smoking status: Former Smoker    Last attempt to quit: 04/19/1980      Years since quitting: 37.7  . Smokeless tobacco: Never Used  Substance Use Topics  . Alcohol use: Yes    Alcohol/week: 14.0 standard drinks    Types: 14 Glasses of wine per week  . Drug use: No    Current Outpatient Medications:  .  ALPRAZolam (XANAX) 0.25 MG tablet, Take 1 tablet (0.25 mg total) by mouth 2 (two) times daily as needed for anxiety., Disp: 30 tablet, Rfl: 0 .  aspirin EC 81 MG tablet, Take 81 mg by mouth daily., Disp: , Rfl:  .  atorvastatin (LIPITOR) 10 MG tablet, Take 1 tablet (10 mg total) by mouth daily., Disp: 90 tablet, Rfl: 1 .  Clobetasol Propionate 0.05 % lotion, Apply to affected areas daily for one week when having symptoms., Disp: 59 mL, Rfl: 3 .  lisinopril-hydrochlorothiazide (PRINZIDE,ZESTORETIC) 20-12.5 MG tablet, Take 1 tablet by mouth daily., Disp: 90 tablet, Rfl: 1 .  Multiple Vitamins-Minerals (MULTIVITAMIN WITH MINERALS) tablet, Take 1 tablet by mouth daily., Disp: , Rfl:  .  mupirocin ointment (BACTROBAN) 2 %, Apply 1 application topically 2 (two) times daily., Disp: 22 g, Rfl: 0 Allergies: Patient has no known allergies.  ROS  Objective: BP 140/80   Ht _0  (1.676 m)   Wt 156 lb (70.8 kg)   BMI 25.18 kg/m   [employer]   01/11/18  4128  Weight: 156 lb (70.8 kg)   Body mass index is 25.18 kg/m. OBGyn Exam  Assessment: Annual Exam 1. Annual physical exam   2. Lichen sclerosus   3. Screen for colon cancer    Plan:            1.  Cervical Screening-  Pap smear schedule reviewed with patient  2. Breast screening- Exam annually and mammogram scheduled, later today  3. Colonoscopy every 10 years, Hemoccult testing after age 44  4. Labs managed by PCP  5. Counseling for hormonal therapy: none  6. Clobetasol for LS  7.  She presents with a significant personal and/or family history of breast cancer daughter, paternal aunt and cousin. Details of which can be found in her medical/family history. She does not have a previously  identified BRCA and Lynch syndrome mutation in her family. Due to her personal and/or family history of cancer she is a candidate for the [facility] test(s).    Risk for cancer, genetic susceptibility discussed.  Patient has undecided gene testing.  Discussed BRCA as well as Lynch syndrome and other cancer risk assessments available based on her family history and personal history. Pros and cons of testing discussed.     F/U  Return in about 1 year (around 01/12/2019) for Annual.  Barnett Applebaum, MD, [facility], [facility] 01/11/2018  8:18 AM

## 2018-01-25 LAB — FECAL OCCULT BLOOD, IMMUNOCHEMICAL: Fecal Occult Bld: NEGATIVE

## 2018-01-25 LAB — SPECIMEN STATUS REPORT

## 2018-01-27 ENCOUNTER — Ambulatory Visit (INDEPENDENT_AMBULATORY_CARE_PROVIDER_SITE_OTHER): Payer: PRIVATE HEALTH INSURANCE

## 2018-01-27 DIAGNOSIS — Z23 Encounter for immunization: Secondary | ICD-10-CM

## 2018-01-31 ENCOUNTER — Encounter: Payer: Self-pay | Admitting: Obstetrics and Gynecology

## 2018-03-20 ENCOUNTER — Encounter: Payer: Self-pay | Admitting: Family Medicine

## 2018-03-20 ENCOUNTER — Ambulatory Visit (INDEPENDENT_AMBULATORY_CARE_PROVIDER_SITE_OTHER): Payer: PRIVATE HEALTH INSURANCE | Admitting: Family Medicine

## 2018-03-20 VITALS — BP 130/80 | HR 80 | Ht 66.0 in | Wt 159.0 lb

## 2018-03-20 DIAGNOSIS — E782 Mixed hyperlipidemia: Secondary | ICD-10-CM | POA: Diagnosis not present

## 2018-03-20 DIAGNOSIS — F419 Anxiety disorder, unspecified: Secondary | ICD-10-CM | POA: Diagnosis not present

## 2018-03-20 DIAGNOSIS — I10 Essential (primary) hypertension: Secondary | ICD-10-CM | POA: Diagnosis not present

## 2018-03-20 DIAGNOSIS — R69 Illness, unspecified: Secondary | ICD-10-CM

## 2018-03-20 MED ORDER — LISINOPRIL-HYDROCHLOROTHIAZIDE 20-12.5 MG PO TABS: 1.0000 | ORAL_TABLET | Freq: Every day | ORAL | 1 refills | Status: DC

## 2018-03-20 MED ORDER — ATORVASTATIN CALCIUM 10 MG PO TABS: 10.0000 mg | ORAL_TABLET | Freq: Every day | ORAL | 1 refills | Status: DC

## 2018-03-20 NOTE — Progress Notes (Signed)
Date:  03/20/2018   Name:  Ebony Harris   DOB:  12-18-1955   MRN:  [id number]   Chief Complaint: Hypertension and Hyperlipidemia (needs labs) Hypertension  This is a chronic problem. The current episode started more than 1 year ago. The problem is unchanged. The problem is controlled. Pertinent negatives include no anxiety, blurred vision, chest pain, headaches, malaise/fatigue, neck pain, orthopnea, palpitations, peripheral edema, PND, shortness of breath or sweats. There are no associated agents to hypertension. There are no known risk factors for coronary artery disease. Past treatments include ACE inhibitors and diuretics. The current treatment provides moderate improvement. There are no compliance problems.  There is no history of angina, kidney disease, CAD/MI, CVA, heart failure, left ventricular hypertrophy, PVD or retinopathy. There is no history of chronic renal disease, a hypertension causing med or renovascular disease.  Hyperlipidemia  This is a chronic problem. The current episode started more than 1 year ago. The problem is controlled. Recent lipid tests were reviewed and are normal. She has no history of chronic renal disease, diabetes, hypothyroidism, liver disease or obesity. There are no known factors aggravating her hyperlipidemia. Pertinent negatives include no chest pain, focal sensory loss, focal weakness, leg pain, myalgias or shortness of breath. Current antihyperlipidemic treatment includes statins. The current treatment provides moderate improvement of lipids. There are no compliance problems.   Anxiety  Presents for follow-up visit. Symptoms include nervous/anxious behavior and panic. Patient reports no chest pain, compulsions, confusion, decreased concentration, depressed mood, dizziness, excessive worry, feeling of choking, hyperventilation, impotence, insomnia, irritability, malaise, muscle tension, nausea, obsessions, palpitations, restlessness, shortness of breath or  suicidal ideas.       Review of Systems  Constitutional: Negative.  Negative for chills, fatigue, fever, irritability, malaise/fatigue and unexpected weight change.  HENT: Negative for congestion, ear discharge, ear pain, rhinorrhea, sinus pressure, sneezing and sore throat.   Eyes: Negative for blurred vision, photophobia, pain, discharge, redness and itching.  Respiratory: Negative for cough, shortness of breath, wheezing and stridor.   Cardiovascular: Negative for chest pain, palpitations, orthopnea and PND.  Gastrointestinal: Negative for abdominal pain, blood in stool, constipation, diarrhea, nausea and vomiting.  Endocrine: Negative for cold intolerance, heat intolerance, polydipsia, polyphagia and polyuria.  Genitourinary: Negative for dysuria, flank pain, frequency, hematuria, impotence, menstrual problem, pelvic pain, urgency, vaginal bleeding and vaginal discharge.  Musculoskeletal: Negative for arthralgias, back pain, myalgias and neck pain.  Skin: Negative for rash.  Allergic/Immunologic: Negative for environmental allergies and food allergies.  Neurological: Negative for dizziness, focal weakness, weakness, light-headedness, numbness and headaches.  Hematological: Negative for adenopathy. Does not bruise/bleed easily.  Psychiatric/Behavioral: Negative for confusion, decreased concentration, dysphoric mood and suicidal ideas. The patient is nervous/anxious. The patient does not have insomnia.     Patient Active Problem List   Diagnosis Date Noted  . Lichen sclerosus 01/11/2018  . Special screening for malignant neoplasms, colon   . Rectal polyp   . Allergic rhinitis 09/26/2015  . Herpes simplex 09/24/2015  . Hypertension 11/26/2014    No Known Allergies  Past Surgical History:  Procedure Laterality Date  . ABDOMINAL HYSTERECTOMY     partial  . BREAST BIOPSY Left    03/2014 negative  . BREAST BIOPSY Left    1992 negative  . BREAST CYST ASPIRATION Left     negative  . COLONOSCOPY WITH PROPOFOL N/A 02/09/2016   Procedure: COLONOSCOPY WITH PROPOFOL;  Surgeon: Midge Miniumarren Wohl, MD;  [facility];  Service: Endoscopy;  Laterality: N/A;  .  POLYPECTOMY  02/09/2016   Procedure: POLYPECTOMY;  Surgeon: Midge Minium, MD;  [facility];  Service: Endoscopy;;    Social History   Tobacco Use  . Smoking status: Former Smoker    Last attempt to quit: 04/19/1980    Years since quitting: 37.9  . Smokeless tobacco: Never Used  Substance Use Topics  . Alcohol use: Yes    Alcohol/week: 14.0 standard drinks    Types: 14 Glasses of wine per week  . Drug use: No     Medication list has been reviewed and updated.  Current Meds  Medication Sig  . aspirin EC 81 MG tablet Take 81 mg by mouth daily.  Marland Kitchen atorvastatin (LIPITOR) 10 MG tablet Take 1 tablet (10 mg total) by mouth daily.  . Clobetasol Propionate 0.05 % lotion Apply to affected areas daily for one week when having symptoms.  Marland Kitchen lisinopril-hydrochlorothiazide (PRINZIDE,ZESTORETIC) 20-12.5 MG tablet Take 1 tablet by mouth daily.  . Multiple Vitamins-Minerals (MULTIVITAMIN WITH MINERALS) tablet Take 1 tablet by mouth daily.  . [DISCONTINUED] mupirocin ointment (BACTROBAN) 2 % Apply 1 application topically 2 (two) times daily.    PHQ 2/9 Scores 12/15/2017 09/30/2017 03/25/2017  PHQ - 2 Score 0 0 0  PHQ- 9 Score 0 2 1    Physical Exam  Constitutional: She appears well-developed. No distress.  HENT:  Head: Normocephalic and atraumatic.  Right Ear: External ear normal.  Left Ear: External ear normal.  Nose: Nose normal.  Mouth/Throat: Oropharynx is clear and moist.  Eyes: Pupils are equal, round, and reactive to light. Conjunctivae and EOM are normal. Right eye exhibits no discharge. Left eye exhibits no discharge.  Neck: Normal range of motion. Neck supple. No JVD present. No thyromegaly present.  Cardiovascular: Normal rate, regular rhythm, normal heart sounds and intact  distal pulses. Exam reveals no gallop and no friction rub.  No murmur heard. Pulmonary/Chest: Effort normal and breath sounds normal.  Abdominal: Soft. Bowel sounds are normal. She exhibits no mass. There is no tenderness. There is no guarding.  Musculoskeletal: Normal range of motion. She exhibits no edema.  Lymphadenopathy:    She has no cervical adenopathy.  Neurological: She is alert. She has normal reflexes.  Skin: Skin is warm and dry. She is not diaphoretic.  Scalp/seborrhea derm/ right hand wart/ abd seb keratitis  Nursing note and vitals reviewed.   BP 130/80   Pulse 80   Ht 5\' 6"  (1.676 m)   Wt 159 lb (72.1 kg)   BMI 25.66 kg/m   Assessment and Plan: 1. Essential hypertension Chronic.  Controlled.  Continue lisinopril HCTZ 20-12 0.5.  Daily.  Will check renal function panel.  Recheck in 6 months. - Renal function panel - lisinopril-hydrochlorothiazide (PRINZIDE,ZESTORETIC) 20-12.5 MG tablet; Take 1 tablet by mouth daily.  Dispense: 90 tablet; Refill: 1  2. Mixed hyperlipidemia Chronic.  Controlled.  Continue atorvastatin 10 mg 1 tablet daily.  Will recheck lipid panel. - Lipid Panel With LDL/HDL Ratio - atorvastatin (LIPITOR) 10 MG tablet; Take 1 tablet (10 mg total) by mouth daily.  Dispense: 90 tablet; Refill: 1  3. Taking medication for chronic disease Patient currently on a statin and will recheck hepatic function. - Hepatic Function Panel (6)  4. Anxiety Chronic.  Episodic panic disorder.  Will continue with Xanax 0.25 mg as needed. 5.  Wart.  Located on right hand.  Patient was offered dermatology appointment if needed.  We will watch at this time    Dr. Elizabeth Sauer  [facility]  03/20/2018

## 2018-03-21 LAB — LIPID PANEL WITH LDL/HDL RATIO
Cholesterol, Total: 186 mg/dL (ref 100–199)
HDL: 64 mg/dL (ref >39)
LDL Calculated: 99 mg/dL (ref 0–99)
LDl/HDL Ratio: 1.5 ratio (ref 0.0–3.2)
TRIGLYCERIDES: 113 mg/dL (ref 0–149)
VLDL Cholesterol Cal: 23 mg/dL (ref 5–40)

## 2018-03-21 LAB — RENAL FUNCTION PANEL
ALBUMIN: 4.2 g/dL (ref 3.6–4.8)
BUN/Creatinine Ratio: 29 — ABNORMAL HIGH (ref 12–28)
BUN: 22 mg/dL (ref 8–27)
CHLORIDE: 100 mmol/L (ref 96–106)
CO2: 26 mmol/L (ref 20–29)
Calcium: 9.3 mg/dL (ref 8.7–10.3)
Creatinine, Ser: 0.77 mg/dL (ref 0.57–1.00)
GFR calc Af Amer: 95 mL/min/{1.73_m2} (ref >59)
GFR calc non Af Amer: 82 mL/min/{1.73_m2} (ref >59)
GLUCOSE: 90 mg/dL (ref 65–99)
PHOSPHORUS: 3.7 mg/dL (ref 2.5–4.5)
POTASSIUM: 4.1 mmol/L (ref 3.5–5.2)
Sodium: 140 mmol/L (ref 134–144)

## 2018-03-21 LAB — HEPATIC FUNCTION PANEL (6)
ALT: 53 IU/L — AB (ref 0–32)
AST: 28 IU/L (ref 0–40)
Alkaline Phosphatase: 91 IU/L (ref 39–117)
Bilirubin Total: 0.4 mg/dL (ref 0.0–1.2)
Bilirubin, Direct: 0.13 mg/dL (ref 0.00–0.40)

## 2018-05-29 ENCOUNTER — Other Ambulatory Visit: Payer: Self-pay

## 2018-05-29 DIAGNOSIS — N6310 Unspecified lump in the right breast, unspecified quadrant: Secondary | ICD-10-CM

## 2018-05-29 NOTE — Progress Notes (Unsigned)
Ordered mammo 

## 2018-07-13 ENCOUNTER — Other Ambulatory Visit: Payer: 59

## 2018-08-23 ENCOUNTER — Other Ambulatory Visit: Payer: Self-pay

## 2018-08-23 ENCOUNTER — Ambulatory Visit
Admission: RE | Admit: 2018-08-23 | Discharge: 2018-08-23 | Disposition: A | Discharge status: Home / Self Care | Payer: 59 | Source: Ambulatory Visit | Attending: Family Medicine | Admitting: Family Medicine

## 2018-08-23 DIAGNOSIS — N6310 Unspecified lump in the right breast, unspecified quadrant: Secondary | ICD-10-CM | POA: Insufficient documentation

## 2018-08-24 ENCOUNTER — Ambulatory Visit (INDEPENDENT_AMBULATORY_CARE_PROVIDER_SITE_OTHER): Payer: PRIVATE HEALTH INSURANCE | Admitting: Family Medicine

## 2018-08-24 ENCOUNTER — Encounter: Payer: Self-pay | Admitting: Family Medicine

## 2018-08-24 VITALS — BP 124/88 | HR 80 | Ht 66.0 in | Wt 157.0 lb

## 2018-08-24 DIAGNOSIS — N6311 Unspecified lump in the right breast, upper outer quadrant: Secondary | ICD-10-CM | POA: Diagnosis not present

## 2018-08-24 DIAGNOSIS — R928 Other abnormal and inconclusive findings on diagnostic imaging of breast: Secondary | ICD-10-CM

## 2018-08-24 NOTE — Progress Notes (Signed)
Date:  08/24/2018   Name:  Ebony Harris   DOB:  November 08, 1955   MRN:  [id number]   Chief Complaint: consultation of mammo  Patient to discussed findings of last mammogram. ?multicystic architecture.    Review of Systems  Constitutional: Negative.  Negative for chills, fatigue, fever and unexpected weight change.  HENT: Negative for congestion, ear discharge, ear pain, rhinorrhea, sinus pressure, sneezing and sore throat.   Eyes: Negative for photophobia, pain, discharge, redness and itching.  Respiratory: Negative for cough, shortness of breath, wheezing and stridor.   Gastrointestinal: Negative for abdominal pain, blood in stool, constipation, diarrhea, nausea and vomiting.  Endocrine: Negative for cold intolerance, heat intolerance, polydipsia, polyphagia and polyuria.  Genitourinary: Negative for dysuria, flank pain, frequency, hematuria, menstrual problem, pelvic pain, urgency, vaginal bleeding and vaginal discharge.  Musculoskeletal: Negative for arthralgias, back pain and myalgias.  Skin: Negative for rash.  Allergic/Immunologic: Negative for environmental allergies and food allergies.  Neurological: Negative for dizziness, weakness, light-headedness, numbness and headaches.  Hematological: Negative for adenopathy. Does not bruise/bleed easily.  Psychiatric/Behavioral: Negative for dysphoric mood. The patient is not nervous/anxious.     Patient Active Problem List   Diagnosis Date Noted  . Lichen sclerosus 01/11/2018  . Special screening for malignant neoplasms, colon   . Rectal polyp   . Allergic rhinitis 09/26/2015  . Herpes simplex 09/24/2015  . Hypertension 11/26/2014    No Known Allergies  Past Surgical History:  Procedure Laterality Date  . ABDOMINAL HYSTERECTOMY     partial  . BREAST BIOPSY Left    03/2014 negative  . BREAST BIOPSY Left    1992 negative  . BREAST CYST ASPIRATION Left    negative  . COLONOSCOPY WITH PROPOFOL N/A 02/09/2016   Procedure:  COLONOSCOPY WITH PROPOFOL;  Surgeon: Midge Minium, MD;  Location: [facility];  Service: Endoscopy;  Laterality: N/A;  . POLYPECTOMY  02/09/2016   Procedure: POLYPECTOMY;  Surgeon: Midge Minium, MD;  Location: [facility];  Service: Endoscopy;;    Social History   Tobacco Use  . Smoking status: Former Smoker    Last attempt to quit: 04/19/1980    Years since quitting: 38.3  . Smokeless tobacco: Never Used  Substance Use Topics  . Alcohol use: Yes    Alcohol/week: 14.0 standard drinks    Types: 14 Glasses of wine per week  . Drug use: No     Medication list has been reviewed and updated.  Current Meds  Medication Sig  . aspirin EC 81 MG tablet Take 81 mg by mouth daily.  Marland Kitchen atorvastatin (LIPITOR) 10 MG tablet Take 1 tablet (10 mg total) by mouth daily.  . Clobetasol Propionate 0.05 % lotion Apply to affected areas daily for one week when having symptoms.  Marland Kitchen lisinopril-hydrochlorothiazide (PRINZIDE,ZESTORETIC) 20-12.5 MG tablet Take 1 tablet by mouth daily.  . Multiple Vitamins-Minerals (MULTIVITAMIN WITH MINERALS) tablet Take 1 tablet by mouth daily.    PHQ 2/9 Scores 12/15/2017 09/30/2017 03/25/2017  PHQ - 2 Score 0 0 0  PHQ- 9 Score 0 2 1    BP Readings from Last 3 Encounters:  08/24/18 124/88  03/20/18 130/80  01/11/18 140/80    Physical Exam Vitals signs and nursing note reviewed. Exam conducted with a chaperone present.  Constitutional:      General: She is not in acute distress.    Appearance: She is not diaphoretic.  HENT:     Head: Normocephalic and atraumatic.     Right Ear:  External ear normal.     Left Ear: External ear normal.     Nose: Nose normal.  Eyes:     General:        Right eye: No discharge.        Left eye: No discharge.     Conjunctiva/sclera: Conjunctivae normal.     Pupils: Pupils are equal, round, and reactive to light.  Neck:     Musculoskeletal: Normal range of motion and neck supple.     Thyroid: No thyromegaly.      Vascular: No JVD.  Cardiovascular:     Rate and Rhythm: Normal rate and regular rhythm.     Heart sounds: Normal heart sounds. No murmur. No friction rub. No gallop.   Pulmonary:     Effort: Pulmonary effort is normal.     Breath sounds: Normal breath sounds.  Chest:     Chest wall: No mass.     Breasts:        Right: Mass present. No swelling, bleeding, inverted nipple, nipple discharge, skin change or tenderness.        Left: Normal. No swelling, bleeding, inverted nipple, mass, nipple discharge, skin change or tenderness.       Comments: Fullness at 10 o'clock Abdominal:     General: Bowel sounds are normal.     Palpations: Abdomen is soft. There is no mass.     Tenderness: There is no abdominal tenderness. There is no guarding.  Musculoskeletal: Normal range of motion.  Lymphadenopathy:     Cervical: No cervical adenopathy.     Upper Body:     Right upper body: No supraclavicular, axillary or pectoral adenopathy.     Left upper body: No supraclavicular, axillary or pectoral adenopathy.  Skin:    General: Skin is warm and dry.  Neurological:     Mental Status: She is alert.     Deep Tendon Reflexes: Reflexes are normal and symmetric.     Wt Readings from Last 3 Encounters:  08/24/18 157 lb (71.2 kg)  03/20/18 159 lb (72.1 kg)  01/11/18 156 lb (70.8 kg)    BP 124/88   Pulse 80   Ht 5\' 6"  (1.676 m)   Wt 157 lb (71.2 kg)   BMI 25.34 kg/m   Assessment and Plan:  1. Abnormal mammogram of right breast Patient had an abnormal mammogram of her right breast which shows a echo colic mass with apparently multiple cystlike areas.  This is followed by radiology but there are some areas that the patient and I discussed.  Patient did have a similar mass on the left side which ultimately was aspirated and had surgery to remove the the cystlike area by history patient says that this was not malignant.  So this is following a similar course and there is some suggestion that we will  follow this by serial MRI mammograms in the future.  We also reviewed the fact that suggestion that genetic testing be considered.  Just for clarification we would like to assess Dr. Lemar Livings to evaluate her mammograms and we will try to obtain her previous evaluations of the left breast.  2. Mass of upper outer quadrant of right breast There is a palpable fullness that is outlined at about 10:00.  There is some mild tenderness.  I think this correlates with the cystic area that is noted on mammography.  In the past she underwent aspiration and removal of an area of a similar nature and patient and I  would agree would like to assure herself that everything would be fine in the direction that we choose to take at this point.  Furl was made to Dr. Doristine CounterBurnett and oblique patient will be seen tomorrow.

## 2018-08-25 ENCOUNTER — Ambulatory Visit: Payer: 59 | Admitting: Surgery

## 2018-08-29 ENCOUNTER — Ambulatory Visit (INDEPENDENT_AMBULATORY_CARE_PROVIDER_SITE_OTHER): Payer: 59 | Admitting: General Surgery

## 2018-08-29 ENCOUNTER — Other Ambulatory Visit: Payer: Self-pay

## 2018-08-29 ENCOUNTER — Encounter: Payer: Self-pay | Admitting: General Surgery

## 2018-08-29 VITALS — BP 183/106 | HR 65 | Temp 97.5°F | Resp 16 | Ht 66.0 in | Wt 157.0 lb

## 2018-08-29 DIAGNOSIS — N6009 Solitary cyst of unspecified breast: Secondary | ICD-10-CM | POA: Diagnosis not present

## 2018-08-29 NOTE — Progress Notes (Signed)
Patient ID: Ebony Harris, female   DOB: 09-11-1955, 63 y.o.   MRN: [id number]  Chief Complaint  Patient presents with  . Other    HPI Ebony Harris is a 63 y.o. female who presents for a breast evaluation. The most recent mammogram and ultrasound was done on 08/23/2018.  Patient does perform regular self breast checks and gets regular mammograms done.  Lost her daughter to breast cancer. Six years ago.   HPI  Past Medical History:  Diagnosis Date  . Family history of breast cancer    daughter was BRCA neg; pt qualifies for My Risk testing; letter sent 5/18  . Fibrocystic breast changes   . Heart murmur   . Hypertension   . Lichen sclerosus   . Wears contact lenses     Past Surgical History:  Procedure Laterality Date  . ABDOMINAL HYSTERECTOMY     partial  . BREAST BIOPSY Left    03/2014 negative  . BREAST BIOPSY Left    1992 negative  . BREAST CYST ASPIRATION Left    negative  . COLONOSCOPY WITH PROPOFOL N/A 02/09/2016   Procedure: COLONOSCOPY WITH PROPOFOL;  Surgeon: Lucilla Lame, MD;  Location: [facility];  Service: Endoscopy;  Laterality: N/A;  . POLYPECTOMY  02/09/2016   Procedure: POLYPECTOMY;  Surgeon: Lucilla Lame, MD;  Location: [facility];  Service: Endoscopy;;    Family History  Problem Relation Age of Onset  . Breast cancer Daughter 71       died at 58 with mets; BRCA neg  . Heart disease Father   . Hypertension Father   . Breast cancer Paternal Aunt 61  . Breast cancer Other        age 73, 33     Social History Social History   Tobacco Use  . Smoking status: Former Smoker    Last attempt to quit: 04/19/1980    Years since quitting: 38.3  . Smokeless tobacco: Never Used  Substance Use Topics  . Alcohol use: Yes    Alcohol/week: 14.0 standard drinks    Types: 14 Glasses of wine per week  . Drug use: No    No Known Allergies  Current Outpatient Medications  Medication Sig Dispense Refill  . ALPRAZolam (XANAX) 0.25 MG tablet Take 1  tablet (0.25 mg total) by mouth 2 (two) times daily as needed for anxiety. 30 tablet 0  . aspirin EC 81 MG tablet Take 81 mg by mouth daily.    Marland Kitchen atorvastatin (LIPITOR) 10 MG tablet Take 1 tablet (10 mg total) by mouth daily. 90 tablet 1  . Clobetasol Propionate 0.05 % lotion Apply to affected areas daily for one week when having symptoms. 59 mL 3  . lisinopril-hydrochlorothiazide (PRINZIDE,ZESTORETIC) 20-12.5 MG tablet Take 1 tablet by mouth daily. 90 tablet 1  . Multiple Vitamins-Minerals (MULTIVITAMIN WITH MINERALS) tablet Take 1 tablet by mouth daily.     No current facility-administered medications for this visit.     Review of Systems Review of Systems  Constitutional: Negative.   Respiratory: Negative.   Cardiovascular: Negative.     Blood pressure (!) 183/106, pulse 65, temperature (!) 97.5 F (36.4 C), temperature source Skin, resp. rate 16, height '5\' 6"'  (1.676 m), weight 157 lb (71.2 kg), SpO2 98 %.  Physical Exam Physical Exam Constitutional:      Appearance: She is well-developed.  Eyes:     General: No scleral icterus.    Conjunctiva/sclera: Conjunctivae normal.  Neck:     Musculoskeletal: Neck  supple.  Cardiovascular:     Rate and Rhythm: Normal rate and regular rhythm.     Heart sounds: Normal heart sounds.  Pulmonary:     Effort: Pulmonary effort is normal.     Breath sounds: Normal breath sounds.  Chest:     Breasts:        Right: No inverted nipple, mass, nipple discharge, skin change or tenderness.        Left: No inverted nipple, mass, nipple discharge, skin change or tenderness.    Lymphadenopathy:     Cervical: No cervical adenopathy.     Upper Body:     Right upper body: No supraclavicular or axillary adenopathy.     Left upper body: No supraclavicular or axillary adenopathy.  Skin:    General: Skin is warm and dry.  Neurological:     Mental Status: She is alert and oriented to person, place, and time.     Data Reviewed I independently  reviewed the patient's mammogram and ultrasound from Aug 23, 2018 as well as January 11, 2018. The radiologist note of the Tyrer-Cuzick risk score of 29% is noted.  The patient's Baker Janus model risk is significantly lower: 3.3% over 5 years, 13.7% lifetime.  Neither model takes into account the fact that the patient's daughter was BRCA negative.  Bone density report from [facility] of March 07, 2014 showed osteopenia at all levels.   Assessment Stable complex breast cyst by ultrasound.  Elevated cancer risk, candidate for chemoprevention.  Plan We spent more than 50% of today's visit reviewing indications for additional testing (such as MRI) or chemoprevention (such as tamoxifen with a 1% risk of DVT versus an aromatase inhibitor with the risk of worsening osteopenia).  There is certainly a significant discrepancy between the 2 risk prediction models, and neither could be used based on the patient's desires to proceed to MRI imaging or not.  She has dense breasts and certainly small lesions could be missed.  The downside of MRI imaging with frequent false positive was also reviewed.  She is not interested in chemoprevention with an absolute benefit of 1.5% of this is a hard decision to argue with.  She has been encouraged to breast self-exam as she has modest breast volume and she may be able to palpate lesions before they become evident by imaging.  She should consider a bone density as she was osteopenic 5 years ago and has been somewhat reluctant/lacks about making use of calcium supplements.  We had a Michelin discussion about the goal to protect her bones as much as possible before she gets significantly older and the availability of some new agents to improve her bone strength.  The patient should have her annual mammograms later this year to be arranged with her PCP, and she has been encouraged to speak with Dr. Ronnald Ramp about obtaining a bone density exam.  She is welcome to  return at any time for reassessment, especially if she has concerns about her future mammograms or any physical findings.   HPI, Physical Exam, Assessment and Plan have been scribed under the direction and in the presence of Hervey Ard, MD.  Gaspar Cola, CMA  I have completed the exam and reviewed the above documentation for accuracy and completeness.  I agree with the above.  Haematologist has been used and any errors in dictation or transcription are unintentional.  Hervey Ard, M.D., F.A.C.S.   Forest Gleason Byrnett 08/30/2018, 1:36 PM

## 2018-08-29 NOTE — Patient Instructions (Signed)
Take calcium supplement with vitamin D (1200 / day).  Ask Dr. Yetta Barre about a bone density test.

## 2018-08-30 DIAGNOSIS — N6009 Solitary cyst of unspecified breast: Secondary | ICD-10-CM | POA: Insufficient documentation

## 2018-09-25 ENCOUNTER — Encounter: Payer: Self-pay | Admitting: Family Medicine

## 2018-09-25 ENCOUNTER — Other Ambulatory Visit: Payer: Self-pay

## 2018-09-25 ENCOUNTER — Ambulatory Visit (INDEPENDENT_AMBULATORY_CARE_PROVIDER_SITE_OTHER): Payer: PRIVATE HEALTH INSURANCE | Admitting: Family Medicine

## 2018-09-25 VITALS — BP 128/80 | HR 80 | Ht 66.0 in | Wt 158.0 lb

## 2018-09-25 DIAGNOSIS — I10 Essential (primary) hypertension: Secondary | ICD-10-CM

## 2018-09-25 DIAGNOSIS — R740 Nonspecific elevation of levels of transaminase and lactic acid dehydrogenase [LDH]: Secondary | ICD-10-CM

## 2018-09-25 DIAGNOSIS — E782 Mixed hyperlipidemia: Secondary | ICD-10-CM | POA: Diagnosis not present

## 2018-09-25 DIAGNOSIS — R7401 Elevation of levels of liver transaminase levels: Secondary | ICD-10-CM

## 2018-09-25 MED ORDER — LISINOPRIL-HYDROCHLOROTHIAZIDE 20-12.5 MG PO TABS: 1.0000 | ORAL_TABLET | Freq: Every day | ORAL | 1 refills | Status: DC

## 2018-09-25 MED ORDER — ATORVASTATIN CALCIUM 10 MG PO TABS: 10.0000 mg | ORAL_TABLET | Freq: Every day | ORAL | 1 refills | Status: DC

## 2018-09-25 NOTE — Patient Instructions (Signed)

## 2018-09-25 NOTE — Progress Notes (Signed)
Date:  09/25/2018   Name:  Ebony Harris   DOB:  1956-03-11   MRN:  [id number]   Chief Complaint: Hypertension and Hyperlipidemia  Hypertension  This is a chronic problem. The current episode started more than 1 year ago. The problem is unchanged. The problem is controlled. Pertinent negatives include no anxiety, blurred vision, chest pain, headaches, malaise/fatigue, neck pain, orthopnea, palpitations, peripheral edema, PND, shortness of breath or sweats. There are no associated agents to hypertension. Risk factors for coronary artery disease include dyslipidemia and post-menopausal state. Past treatments include diuretics. The current treatment provides moderate improvement. There are no compliance problems.  There is no history of angina, kidney disease, CVA, heart failure, left ventricular hypertrophy, PVD or retinopathy. There is no history of chronic renal disease, a hypertension causing med or renovascular disease.  Hyperlipidemia  This is a chronic problem. The current episode started more than 1 year ago. The problem is controlled. Recent lipid tests were reviewed and are normal. She has no history of chronic renal disease, diabetes, hypothyroidism, liver disease or obesity. Pertinent negatives include no chest pain, myalgias or shortness of breath. Current antihyperlipidemic treatment includes statins. The current treatment provides mild improvement of lipids. There are no compliance problems.  Risk factors for coronary artery disease include post-menopausal and dyslipidemia.    Review of Systems  Constitutional: Negative.  Negative for chills, fatigue, fever, malaise/fatigue and unexpected weight change.  HENT: Negative for congestion, ear discharge, ear pain, rhinorrhea, sinus pressure, sneezing and sore throat.   Eyes: Negative for blurred vision, photophobia, pain, discharge, redness and itching.  Respiratory: Negative for cough, shortness of breath, wheezing and stridor.    Cardiovascular: Negative for chest pain, palpitations, orthopnea and PND.  Gastrointestinal: Negative for abdominal pain, blood in stool, constipation, diarrhea, nausea and vomiting.  Endocrine: Negative for cold intolerance, heat intolerance, polydipsia, polyphagia and polyuria.  Genitourinary: Negative for dysuria, flank pain, frequency, hematuria, menstrual problem, pelvic pain, urgency, vaginal bleeding and vaginal discharge.  Musculoskeletal: Negative for arthralgias, back pain, myalgias and neck pain.  Skin: Negative for rash.  Allergic/Immunologic: Negative for environmental allergies and food allergies.  Neurological: Negative for dizziness, weakness, light-headedness, numbness and headaches.  Hematological: Negative for adenopathy. Does not bruise/bleed easily.  Psychiatric/Behavioral: Negative for dysphoric mood. The patient is not nervous/anxious.     Patient Active Problem List   Diagnosis Date Noted  . Cyst of breast 08/30/2018  . Lichen sclerosus 01/11/2018  . Special screening for malignant neoplasms, colon   . Rectal polyp   . Allergic rhinitis 09/26/2015  . Herpes simplex 09/24/2015  . Hypertension 11/26/2014    No Known Allergies  Past Surgical History:  Procedure Laterality Date  . ABDOMINAL HYSTERECTOMY     partial  . BREAST BIOPSY Left    03/2014 negative  . BREAST BIOPSY Left    1992 negative  . BREAST CYST ASPIRATION Left    negative  . COLONOSCOPY WITH PROPOFOL N/A 02/09/2016   Procedure: COLONOSCOPY WITH PROPOFOL;  Surgeon: Midge Miniumarren Wohl, MD;  [facility];  Service: Endoscopy;  Laterality: N/A;  . POLYPECTOMY  02/09/2016   Procedure: POLYPECTOMY;  Surgeon: Midge Miniumarren Wohl, MD;  [facility];  Service: Endoscopy;;    Social History   Tobacco Use  . Smoking status: Former Smoker    Last attempt to quit: 04/19/1980    Years since quitting: 38.4  . Smokeless tobacco: Never Used  Substance Use Topics  . Alcohol use: Yes  Alcohol/week: 14.0 standard drinks    Types: 14 Glasses of wine per week  . Drug use: No     Medication list has been reviewed and updated.  Current Meds  Medication Sig  . aspirin EC 81 MG tablet Take 81 mg by mouth daily.  Marland Kitchen. atorvastatin (LIPITOR) 10 MG tablet Take 1 tablet (10 mg total) by mouth daily.  . Clobetasol Propionate 0.05 % lotion Apply to affected areas daily for one week when having symptoms.  Marland Kitchen. lisinopril-hydrochlorothiazide (PRINZIDE,ZESTORETIC) 20-12.5 MG tablet Take 1 tablet by mouth daily.  . Multiple Vitamins-Minerals (MULTIVITAMIN WITH MINERALS) tablet Take 1 tablet by mouth daily.    PHQ 2/9 Scores 09/25/2018 12/15/2017 09/30/2017 03/25/2017  PHQ - 2 Score 0 0 0 0  PHQ- 9 Score 0 0 2 1    BP Readings from Last 3 Encounters:  09/25/18 128/80  08/29/18 (!) 183/106  08/24/18 124/88    Physical Exam Vitals signs and nursing note reviewed.  Constitutional:      Appearance: She is well-developed.  HENT:     Head: Normocephalic.     Right Ear: External ear normal.     Left Ear: External ear normal.  Eyes:     General: Lids are everted, no foreign bodies appreciated. No scleral icterus.       Left eye: No foreign body or hordeolum.     Conjunctiva/sclera: Conjunctivae normal.     Right eye: Right conjunctiva is not injected.     Left eye: Left conjunctiva is not injected.     Pupils: Pupils are equal, round, and reactive to light.  Neck:     Musculoskeletal: Normal range of motion and neck supple.     Thyroid: No thyromegaly.     Vascular: No JVD.     Trachea: No tracheal deviation.  Cardiovascular:     Rate and Rhythm: Normal rate and regular rhythm.     Heart sounds: Normal heart sounds. No murmur. No friction rub. No gallop.   Pulmonary:     Effort: Pulmonary effort is normal. No respiratory distress.     Breath sounds: Normal breath sounds. No wheezing or rales.  Abdominal:     General: Bowel sounds are normal.     Palpations: Abdomen is soft.  There is no mass.     Tenderness: There is no abdominal tenderness. There is no guarding or rebound.  Musculoskeletal: Normal range of motion.        General: No tenderness.  Lymphadenopathy:     Cervical: No cervical adenopathy.  Skin:    General: Skin is warm.     Findings: No rash.  Neurological:     Mental Status: She is alert and oriented to person, place, and time.     Cranial Nerves: No cranial nerve deficit.     Deep Tendon Reflexes: Reflexes normal.  Psychiatric:        Mood and Affect: Mood is not anxious or depressed.     Wt Readings from Last 3 Encounters:  09/25/18 158 lb (71.7 kg)  08/29/18 157 lb (71.2 kg)  08/24/18 157 lb (71.2 kg)    BP 128/80   Pulse 80   Ht 5\' 6"  (1.676 m)   Wt 158 lb (71.7 kg)   BMI 25.50 kg/m   Assessment and Plan:  1. Mixed hyperlipidemia Chronic.  Controlled.  Continue atorvastatin 10 mg once a day.  Will recheck a lipid panel.  Recheck in 6 months chronic. - Lipid Panel With LDL/HDL  Ratio - Renal Function Panel - atorvastatin (LIPITOR) 10 MG tablet; Take 1 tablet (10 mg total) by mouth daily.  Dispense: 90 tablet; Refill: 1  2. Essential hypertension Chronic.  Controlled.  Continue lisinopril hydrochlorothiazide 20-12.5 mg once a day will check a renal function panel.  Recheck in 6 months - Hepatic function panel - lisinopril-hydrochlorothiazide (ZESTORETIC) 20-12.5 MG tablet; Take 1 tablet by mouth daily.  Dispense: 90 tablet; Refill: 1  3. Elevated transaminase level Transaminase was elevated on the past reading very mildly we will repeat hepatic function panel Barrett 5 return to normal.

## 2018-09-26 LAB — LIPID PANEL WITH LDL/HDL RATIO
Cholesterol, Total: 184 mg/dL (ref 100–199)
HDL: 64 mg/dL (ref >39)
LDL Calculated: 101 mg/dL — ABNORMAL HIGH (ref 0–99)
LDl/HDL Ratio: 1.6 ratio (ref 0.0–3.2)
Triglycerides: 94 mg/dL (ref 0–149)
VLDL Cholesterol Cal: 19 mg/dL (ref 5–40)

## 2018-09-26 LAB — HEPATIC FUNCTION PANEL
ALT: 37 IU/L — ABNORMAL HIGH (ref 0–32)
AST: 30 IU/L (ref 0–40)
Alkaline Phosphatase: 84 IU/L (ref 39–117)
Bilirubin Total: 0.4 mg/dL (ref 0.0–1.2)
Bilirubin, Direct: 0.12 mg/dL (ref 0.00–0.40)
Total Protein: 6.7 g/dL (ref 6.0–8.5)

## 2018-09-26 LAB — RENAL FUNCTION PANEL
Albumin: 4.7 g/dL (ref 3.8–4.8)
BUN/Creatinine Ratio: 30 — ABNORMAL HIGH (ref 12–28)
BUN: 26 mg/dL (ref 8–27)
CO2: 25 mmol/L (ref 20–29)
Calcium: 9.5 mg/dL (ref 8.7–10.3)
Chloride: 99 mmol/L (ref 96–106)
Creatinine, Ser: 0.87 mg/dL (ref 0.57–1.00)
GFR calc Af Amer: 82 mL/min/{1.73_m2} (ref >59)
GFR calc non Af Amer: 71 mL/min/{1.73_m2} (ref >59)
Glucose: 104 mg/dL — ABNORMAL HIGH (ref 65–99)
Phosphorus: 3.7 mg/dL (ref 3.0–4.3)
Potassium: 4.1 mmol/L (ref 3.5–5.2)
Sodium: 140 mmol/L (ref 134–144)

## 2018-10-31 ENCOUNTER — Other Ambulatory Visit: Payer: Self-pay | Admitting: Family Medicine

## 2018-10-31 ENCOUNTER — Other Ambulatory Visit: Payer: Self-pay

## 2018-10-31 DIAGNOSIS — N6310 Unspecified lump in the right breast, unspecified quadrant: Secondary | ICD-10-CM

## 2018-10-31 DIAGNOSIS — R928 Other abnormal and inconclusive findings on diagnostic imaging of breast: Secondary | ICD-10-CM

## 2018-10-31 DIAGNOSIS — Z1231 Encounter for screening mammogram for malignant neoplasm of breast: Secondary | ICD-10-CM

## 2019-01-11 ENCOUNTER — Other Ambulatory Visit: Payer: Self-pay

## 2019-01-11 ENCOUNTER — Ambulatory Visit (INDEPENDENT_AMBULATORY_CARE_PROVIDER_SITE_OTHER): Payer: BC Managed Care – PPO

## 2019-01-11 DIAGNOSIS — Z23 Encounter for immunization: Secondary | ICD-10-CM | POA: Diagnosis not present

## 2019-01-15 ENCOUNTER — Other Ambulatory Visit: Payer: Self-pay

## 2019-01-15 ENCOUNTER — Encounter: Payer: Self-pay | Admitting: Obstetrics & Gynecology

## 2019-01-15 ENCOUNTER — Ambulatory Visit (INDEPENDENT_AMBULATORY_CARE_PROVIDER_SITE_OTHER): Payer: BC Managed Care – PPO | Admitting: Obstetrics & Gynecology

## 2019-01-15 VITALS — BP 140/90 | Ht 66.0 in | Wt 165.0 lb

## 2019-01-15 DIAGNOSIS — Z01419 Encounter for gynecological examination (general) (routine) without abnormal findings: Secondary | ICD-10-CM

## 2019-01-15 DIAGNOSIS — L900 Lichen sclerosus et atrophicus: Secondary | ICD-10-CM

## 2019-01-15 DIAGNOSIS — Z1211 Encounter for screening for malignant neoplasm of colon: Secondary | ICD-10-CM

## 2019-01-15 MED ORDER — CLOBETASOL PROPIONATE 0.05 % EX LOTN: TOPICAL_LOTION | CUTANEOUS | 3 refills | Status: DC

## 2019-01-15 NOTE — Progress Notes (Signed)
HPI:      [name] is a 63 y.o. [phone] who LMP was in the past )s/p hysterectomy 2003), she presents today for her annual examination.  The patient has no complaints today, just an occasional hot flash; rare flare up from Lichen and occasional vaginal dryness but denies dyspareunia. The patient is sexually active. Herlast pap: approximate date 2017 and was normal and last mammogram: 08/2018, and for follow up 01/2019.  The patient does perform self breast exams.  There is notable family history of breast (daughter, deceased, BRCA NEG) or ovarian cancer in her family. The patient is not taking hormone replacement therapy. Patient denies post-menopausal vaginal bleeding.   The patient has regular exercise: yes. The patient denies current symptoms of depression.    GYN Hx: Last Colonoscopy:2 years ago. Normal.  Last DEXA: not done yet ago.    PMHx: Past Medical History:  Diagnosis Date  . Family history of breast cancer    daughter was BRCA neg; pt qualifies for My Risk testing; letter sent 5/18  . Fibrocystic breast changes   . Heart murmur   . Hypertension   . Lichen sclerosus   . Wears contact lenses    Past Surgical History:  Procedure Laterality Date  . ABDOMINAL HYSTERECTOMY     partial  . BREAST BIOPSY Left    03/2014 negative  . BREAST BIOPSY Left    1992 negative  . BREAST CYST ASPIRATION Left    negative  . COLONOSCOPY WITH PROPOFOL N/A 02/09/2016   Procedure: COLONOSCOPY WITH PROPOFOL;  Surgeon: Lucilla Lame, MD;  Location: [place];  Service: Endoscopy;  Laterality: N/A;  . POLYPECTOMY  02/09/2016   Procedure: POLYPECTOMY;  Surgeon: Lucilla Lame, MD;  Location: [place];  Service: Endoscopy;;   Family History  Problem Relation Age of Onset  . Breast cancer Daughter 11       died at 30 with mets; BRCA neg  . Heart disease Father   . Hypertension Father   . Breast cancer Paternal Aunt 36  . Breast cancer Other        age 96, 49    Social  History   Tobacco Use  . Smoking status: Former Smoker    Quit date: 04/19/1980    Years since quitting: 38.7  . Smokeless tobacco: Never Used  Substance Use Topics  . Alcohol use: Yes    Alcohol/week: 14.0 standard drinks    Types: 14 Glasses of wine per week  . Drug use: No    Current Outpatient Medications:  .  ALPRAZolam (XANAX) 0.25 MG tablet, Take 1 tablet (0.25 mg total) by mouth 2 (two) times daily as needed for anxiety., Disp: 30 tablet, Rfl: 0 .  aspirin EC 81 MG tablet, Take 81 mg by mouth daily., Disp: , Rfl:  .  atorvastatin (LIPITOR) 10 MG tablet, Take 1 tablet (10 mg total) by mouth daily., Disp: 90 tablet, Rfl: 1 .  Clobetasol Propionate 0.05 % lotion, Apply to affected areas daily for one week when having symptoms., Disp: 59 mL, Rfl: 3 .  lisinopril-hydrochlorothiazide (ZESTORETIC) 20-12.5 MG tablet, Take 1 tablet by mouth daily., Disp: 90 tablet, Rfl: 1 .  Multiple Vitamins-Minerals (MULTIVITAMIN WITH MINERALS) tablet, Take 1 tablet by mouth daily., Disp: , Rfl:  Allergies: Patient has no known allergies.  Review of Systems  Constitutional: Negative for chills, fever and malaise/fatigue.  HENT: Negative for congestion, sinus pain and sore throat.   Eyes: Negative for blurred vision  and pain.  Respiratory: Negative for cough and wheezing.   Cardiovascular: Negative for chest pain and leg swelling.  Gastrointestinal: Negative for abdominal pain, constipation, diarrhea, heartburn, nausea and vomiting.  Genitourinary: Negative for dysuria, frequency, hematuria and urgency.  Musculoskeletal: Negative for back pain, joint pain, myalgias and neck pain.  Skin: Negative for itching and rash.  Neurological: Negative for dizziness, tremors and weakness.  Endo/Heme/Allergies: Does not bruise/bleed easily.  Psychiatric/Behavioral: Negative for depression. The patient is not nervous/anxious and does not have insomnia.     Objective: BP 140/90   Ht '5\' 6"'  (1.676 m)   Wt 165  lb (74.8 kg)   BMI 26.63 kg/m   Filed Weights   01/15/19 0912  Weight: 165 lb (74.8 kg)   Body mass index is 26.63 kg/m. Physical Exam Constitutional:      General: She is not in acute distress.    Appearance: She is well-developed.  Genitourinary:     Pelvic exam was performed with patient supine.     Vulva, inguinal canal, urethra, bladder, vagina and rectum normal.     No lesions in the vagina.     No vaginal bleeding.     No right or left adnexal mass present.     Right adnexa not tender.     Left adnexa not tender.     Genitourinary Comments: Min evidence for Lichen Absent Uterus Absent cervix Vaginal cuff well healed  HENT:     Head: Normocephalic and atraumatic. No laceration.     Right Ear: Hearing normal.     Left Ear: Hearing normal.     Mouth/Throat:     Pharynx: Uvula midline.  Eyes:     Pupils: Pupils are equal, round, and reactive to light.  Neck:     Musculoskeletal: Normal range of motion and neck supple.     Thyroid: No thyromegaly.  Cardiovascular:     Rate and Rhythm: Normal rate and regular rhythm.     Heart sounds: No murmur. No friction rub. No gallop.   Pulmonary:     Effort: Pulmonary effort is normal. No respiratory distress.     Breath sounds: Normal breath sounds. No wheezing.  Chest:     Breasts:        Right: No mass, skin change or tenderness.        Left: No mass, skin change or tenderness.  Abdominal:     General: Bowel sounds are normal. There is no distension.     Palpations: Abdomen is soft.     Tenderness: There is no abdominal tenderness. There is no rebound.  Musculoskeletal: Normal range of motion.  Neurological:     Mental Status: She is alert and oriented to person, place, and time.     Cranial Nerves: No cranial nerve deficit.  Skin:    General: Skin is warm and dry.  Psychiatric:        Judgment: Judgment normal.  Vitals signs reviewed.     Assessment: Annual Exam 1. Women's annual routine gynecological  examination   2. Lichen sclerosus   3. Screen for colon cancer     Plan:            1.  Cervical Screening-  Pap smear schedule reviewed with patient  2. Breast screening- Exam annually and mammogram scheduled  3. Colonoscopy every 10 years, Hemoccult testing after age 3  4. Labs managed by PCP  5. Counseling for hormonal therapy: none  6. DEXA is not currently scheduled But it is recommended.    F/U  Return in about 1 year (around 01/15/2020) for Annual.  Barnett Applebaum, MD, [facility], [facility] 01/15/2019  9:36 AM

## 2019-01-15 NOTE — Patient Instructions (Addendum)
PAP every 5 years Mammogram every year    Call [phone] to schedule at [facility] Colonoscopy every 10 years Labs yearly (with PCP)  Replens for vaginal moisturizer therapy, twice weekly

## 2019-01-23 ENCOUNTER — Ambulatory Visit
Admission: RE | Admit: 2019-01-23 | Discharge: 2019-01-23 | Disposition: A | Discharge status: Home / Self Care | Payer: BC Managed Care – PPO | Source: Ambulatory Visit | Attending: Family Medicine | Admitting: Family Medicine

## 2019-01-23 DIAGNOSIS — Z1231 Encounter for screening mammogram for malignant neoplasm of breast: Secondary | ICD-10-CM | POA: Insufficient documentation

## 2019-01-23 DIAGNOSIS — N6311 Unspecified lump in the right breast, upper outer quadrant: Secondary | ICD-10-CM | POA: Diagnosis not present

## 2019-01-23 DIAGNOSIS — R922 Inconclusive mammogram: Secondary | ICD-10-CM | POA: Diagnosis not present

## 2019-01-23 DIAGNOSIS — R928 Other abnormal and inconclusive findings on diagnostic imaging of breast: Secondary | ICD-10-CM

## 2019-01-23 DIAGNOSIS — N6310 Unspecified lump in the right breast, unspecified quadrant: Secondary | ICD-10-CM | POA: Insufficient documentation

## 2019-03-19 ENCOUNTER — Encounter: Payer: Self-pay | Admitting: Family Medicine

## 2019-03-19 ENCOUNTER — Other Ambulatory Visit: Payer: Self-pay

## 2019-03-19 ENCOUNTER — Ambulatory Visit: Payer: BC Managed Care – PPO | Admitting: Family Medicine

## 2019-03-19 VITALS — BP 138/80 | HR 72 | Ht 66.0 in | Wt 165.0 lb

## 2019-03-19 DIAGNOSIS — I10 Essential (primary) hypertension: Secondary | ICD-10-CM

## 2019-03-19 DIAGNOSIS — E782 Mixed hyperlipidemia: Secondary | ICD-10-CM

## 2019-03-19 DIAGNOSIS — F5101 Primary insomnia: Secondary | ICD-10-CM | POA: Diagnosis not present

## 2019-03-19 MED ORDER — LISINOPRIL-HYDROCHLOROTHIAZIDE 20-12.5 MG PO TABS: 1.0000 | ORAL_TABLET | Freq: Every day | ORAL | 1 refills | Status: DC

## 2019-03-19 MED ORDER — ATORVASTATIN CALCIUM 10 MG PO TABS: 10.0000 mg | ORAL_TABLET | Freq: Every day | ORAL | 1 refills | Status: DC

## 2019-03-19 MED ORDER — TRAZODONE HCL 50 MG PO TABS: 25.0000 mg | ORAL_TABLET | Freq: Every evening | ORAL | 3 refills | Status: DC | PRN

## 2019-03-19 NOTE — Progress Notes (Signed)
Date:  03/19/2019   Name:  Ebony Harris   DOB:  1956/04/06   MRN:  [id number]   Chief Complaint: Hyperlipidemia, Hypertension, and leg discoloration (after a bruise)  Hyperlipidemia This is a chronic problem. The current episode started more than 1 year ago. The problem is controlled. Recent lipid tests were reviewed and are normal. She has no history of chronic renal disease, diabetes, hypothyroidism, liver disease, obesity or nephrotic syndrome. Factors aggravating her hyperlipidemia include thiazides. Pertinent negatives include no chest pain, focal sensory loss, focal weakness, leg pain, myalgias or shortness of breath. Current antihyperlipidemic treatment includes statins. The current treatment provides moderate improvement of lipids. There are no compliance problems.  Risk factors for coronary artery disease include dyslipidemia and hypertension.  Hypertension This is a chronic problem. The current episode started more than 1 year ago. The problem has been gradually improving since onset. The problem is controlled. Pertinent negatives include no anxiety, blurred vision, chest pain, headaches, malaise/fatigue, neck pain, orthopnea, palpitations, peripheral edema, PND, shortness of breath or sweats. There are no associated agents to hypertension. Risk factors for coronary artery disease include dyslipidemia. The current treatment provides moderate improvement. There are no compliance problems.  There is no history of angina, kidney disease, CAD/MI, CVA, heart failure, left ventricular hypertrophy, PVD or retinopathy. There is no history of chronic renal disease, a hypertension causing med or renovascular disease.  Insomnia Primary symptoms: fragmented sleep, no sleep disturbance, difficulty falling asleep, no somnolence, no frequent awakening, no premature morning awakening, no malaise/fatigue, no napping.   The onset quality is gradual. The problem occurs intermittently. The problem has been  waxing and waning since onset. The symptoms are aggravated by anxiety.    Lab Results  Component Value Date   CREATININE 0.87 09/25/2018   BUN 26 09/25/2018   NA 140 09/25/2018   K 4.1 09/25/2018   CL 99 09/25/2018   CO2 25 09/25/2018   Lab Results  Component Value Date   CHOL 184 09/25/2018   HDL 64 09/25/2018   LDLCALC 101 (H) 09/25/2018   TRIG 94 09/25/2018   CHOLHDL 4.6 (H) 09/30/2017   No results found for: TSH No results found for: HGBA1C   Review of Systems  Constitutional: Negative for chills, fever and malaise/fatigue.  HENT: Negative for drooling, ear discharge, ear pain and sore throat.   Eyes: Negative for blurred vision.  Respiratory: Negative for cough, shortness of breath and wheezing.   Cardiovascular: Negative for chest pain, palpitations, orthopnea, leg swelling and PND.  Gastrointestinal: Negative for abdominal pain, blood in stool, constipation, diarrhea and nausea.  Endocrine: Negative for polydipsia.  Genitourinary: Negative for dysuria, frequency, hematuria and urgency.  Musculoskeletal: Negative for back pain, myalgias and neck pain.  Skin: Negative for rash.  Allergic/Immunologic: Negative for environmental allergies.  Neurological: Negative for dizziness, focal weakness and headaches.  Hematological: Does not bruise/bleed easily.  Psychiatric/Behavioral: Negative for sleep disturbance and suicidal ideas. The patient has insomnia. The patient is not nervous/anxious.     Patient Active Problem List   Diagnosis Date Noted  . Mixed hyperlipidemia 09/25/2018  . Cyst of breast 08/30/2018  . Lichen sclerosus 01/11/2018  . Special screening for malignant neoplasms, colon   . Rectal polyp   . Allergic rhinitis 09/26/2015  . Herpes simplex 09/24/2015  . Hypertension 11/26/2014    No Known Allergies  Past Surgical History:  Procedure Laterality Date  . ABDOMINAL HYSTERECTOMY     partial  . BREAST BIOPSY Left  03/2014 negative  . BREAST  BIOPSY Left    1992 negative  . BREAST CYST ASPIRATION Left    negative  . COLONOSCOPY WITH PROPOFOL N/A 02/09/2016   Procedure: COLONOSCOPY WITH PROPOFOL;  Surgeon: Lucilla Lame, MD;  Location: [place];  Service: Endoscopy;  Laterality: N/A;  . POLYPECTOMY  02/09/2016   Procedure: POLYPECTOMY;  Surgeon: Lucilla Lame, MD;  Location: [place];  Service: Endoscopy;;    Social History   Tobacco Use  . Smoking status: Former Smoker    Quit date: 04/19/1980    Years since quitting: 38.9  . Smokeless tobacco: Never Used  Substance Use Topics  . Alcohol use: Yes    Alcohol/week: 14.0 standard drinks    Types: 14 Glasses of wine per week  . Drug use: No     Medication list has been reviewed and updated.  Current Meds  Medication Sig  . ALPRAZolam (XANAX) 0.25 MG tablet Take 1 tablet (0.25 mg total) by mouth 2 (two) times daily as needed for anxiety.  Marland Kitchen aspirin EC 81 MG tablet Take 81 mg by mouth daily.  Marland Kitchen atorvastatin (LIPITOR) 10 MG tablet Take 1 tablet (10 mg total) by mouth daily.  . Calcium Carbonate-Vit D-Min (CALTRATE 600+D PLUS MINERALS PO) Take 1 tablet by mouth daily.  . Clobetasol Propionate 0.05 % lotion Apply to affected areas daily for one week when having symptoms.  Marland Kitchen lisinopril-hydrochlorothiazide (ZESTORETIC) 20-12.5 MG tablet Take 1 tablet by mouth daily.  . Multiple Vitamins-Minerals (MULTIVITAMIN WITH MINERALS) tablet Take 1 tablet by mouth daily.    PHQ 2/9 Scores 09/25/2018 12/15/2017 09/30/2017 03/25/2017  PHQ - 2 Score 0 0 0 0  PHQ- 9 Score 0 0 2 1    BP Readings from Last 3 Encounters:  03/19/19 138/80  01/15/19 140/90  09/25/18 128/80    Physical Exam Vitals signs and nursing note reviewed.  Constitutional:      Appearance: She is well-developed.  HENT:     Head: Normocephalic.     Right Ear: Tympanic membrane, ear canal and external ear normal.     Left Ear: Tympanic membrane, ear canal and external ear normal.     Nose: Nose  normal.     Mouth/Throat:     Mouth: Mucous membranes are moist.  Eyes:     General: Lids are everted, no foreign bodies appreciated. No scleral icterus.       Right eye: No discharge.        Left eye: No foreign body, discharge or hordeolum.     Extraocular Movements: Extraocular movements intact.     Conjunctiva/sclera: Conjunctivae normal.     Right eye: Right conjunctiva is not injected.     Left eye: Left conjunctiva is not injected.     Pupils: Pupils are equal, round, and reactive to light.  Neck:     Musculoskeletal: Normal range of motion and neck supple.     Thyroid: No thyromegaly.     Vascular: No JVD.     Trachea: No tracheal deviation.  Cardiovascular:     Rate and Rhythm: Normal rate and regular rhythm.     Heart sounds: Normal heart sounds. No murmur. No friction rub. No gallop.   Pulmonary:     Effort: Pulmonary effort is normal. No respiratory distress.     Breath sounds: Normal breath sounds. No wheezing, rhonchi or rales.  Abdominal:     General: Bowel sounds are normal.     Palpations: Abdomen is  soft. There is no mass.     Tenderness: There is no abdominal tenderness. There is no guarding or rebound.  Musculoskeletal: Normal range of motion.        General: No tenderness.  Lymphadenopathy:     Cervical: No cervical adenopathy.  Skin:    General: Skin is warm.     Capillary Refill: Capillary refill takes less than 2 seconds.     Findings: Ecchymosis present. No rash.       Neurological:     Mental Status: She is alert and oriented to person, place, and time.     Cranial Nerves: No cranial nerve deficit.     Motor: No weakness.     Deep Tendon Reflexes: Reflexes normal.  Psychiatric:        Mood and Affect: Mood is not anxious or depressed.     Wt Readings from Last 3 Encounters:  03/19/19 165 lb (74.8 kg)  01/15/19 165 lb (74.8 kg)  09/25/18 158 lb (71.7 kg)    BP 138/80   Pulse 72   Ht 5\' 6"  (1.676 m)   Wt 165 lb (74.8 kg)   BMI 26.63  kg/m   Assessment and Plan: 1. Essential hypertension Chronic.  Controlled.  Continue lisinopril 20-12 0.5 once a day.  Will check comprehensive metabolic panel.  Will recheck in 6 months. - Comprehensive Metabolic Panel (CMET) - lisinopril-hydrochlorothiazide (ZESTORETIC) 20-12.5 MG tablet; Take 1 tablet by mouth daily.  Dispense: 90 tablet; Refill: 1  2. Mixed hyperlipidemia Chronic.  Controlled.  Stable.  Continue atorvastatin 10 mg once a day.  Will check lipid panel.  Will recheck in 6 months. - Lipid Panel With LDL/HDL Ratio - atorvastatin (LIPITOR) 10 MG tablet; Take 1 tablet (10 mg total) by mouth daily.  Dispense: 90 tablet; Refill: 1  3. Primary insomnia New problem patient is having difficulty falling asleep at times as well as early awakening and difficulty resuming sleep.  Will trial trazodone 50 mg 1/2 to 1 tablet nightly and may have refills if successful. - traZODone (DESYREL) 50 MG tablet; Take 0.5-1 tablets (25-50 mg total) by mouth at bedtime as needed for sleep.  Dispense: 30 tablet; Refill: 3

## 2019-03-20 ENCOUNTER — Ambulatory Visit: Payer: PRIVATE HEALTH INSURANCE | Admitting: Family Medicine

## 2019-03-20 LAB — COMPREHENSIVE METABOLIC PANEL
ALT: 52 IU/L — ABNORMAL HIGH (ref 0–32)
AST: 41 IU/L — ABNORMAL HIGH (ref 0–40)
Albumin/Globulin Ratio: 2 (ref 1.2–2.2)
Albumin: 4.3 g/dL (ref 3.8–4.8)
Alkaline Phosphatase: 103 IU/L (ref 39–117)
BUN/Creatinine Ratio: 22 (ref 12–28)
BUN: 19 mg/dL (ref 8–27)
Bilirubin Total: 0.3 mg/dL (ref 0.0–1.2)
CO2: 26 mmol/L (ref 20–29)
Calcium: 9.6 mg/dL (ref 8.7–10.3)
Chloride: 101 mmol/L (ref 96–106)
Creatinine, Ser: 0.88 mg/dL (ref 0.57–1.00)
GFR calc Af Amer: 80 mL/min/{1.73_m2} (ref >59)
GFR calc non Af Amer: 70 mL/min/{1.73_m2} (ref >59)
Globulin, Total: 2.2 g/dL (ref 1.5–4.5)
Glucose: 91 mg/dL (ref 65–99)
Potassium: 4.4 mmol/L (ref 3.5–5.2)
Sodium: 141 mmol/L (ref 134–144)
Total Protein: 6.5 g/dL (ref 6.0–8.5)

## 2019-03-20 LAB — LIPID PANEL WITH LDL/HDL RATIO
Cholesterol, Total: 178 mg/dL (ref 100–199)
HDL: 58 mg/dL (ref >39)
LDL Chol Calc (NIH): 99 mg/dL (ref 0–99)
LDL/HDL Ratio: 1.7 ratio (ref 0.0–3.2)
Triglycerides: 117 mg/dL (ref 0–149)
VLDL Cholesterol Cal: 21 mg/dL (ref 5–40)

## 2019-05-03 ENCOUNTER — Other Ambulatory Visit: Payer: BC Managed Care – PPO

## 2019-05-03 ENCOUNTER — Other Ambulatory Visit: Payer: Self-pay

## 2019-05-03 DIAGNOSIS — R748 Abnormal levels of other serum enzymes: Secondary | ICD-10-CM

## 2019-05-03 NOTE — Progress Notes (Signed)
Printed labs

## 2019-05-04 LAB — HEPATIC FUNCTION PANEL
ALT: 39 IU/L — ABNORMAL HIGH (ref 0–32)
AST: 30 IU/L (ref 0–40)
Albumin: 4.4 g/dL (ref 3.8–4.8)
Alkaline Phosphatase: 95 IU/L (ref 39–117)
Bilirubin Total: 0.3 mg/dL (ref 0.0–1.2)
Bilirubin, Direct: 0.1 mg/dL (ref 0.00–0.40)
Total Protein: 6.4 g/dL (ref 6.0–8.5)

## 2019-07-27 ENCOUNTER — Encounter: Payer: Self-pay | Admitting: Family Medicine

## 2019-07-27 ENCOUNTER — Ambulatory Visit: Payer: BC Managed Care – PPO | Admitting: Family Medicine

## 2019-07-27 ENCOUNTER — Other Ambulatory Visit: Payer: Self-pay

## 2019-07-27 VITALS — BP 126/90 | HR 80 | Ht 66.0 in | Wt 161.0 lb

## 2019-07-27 DIAGNOSIS — M7632 Iliotibial band syndrome, left leg: Secondary | ICD-10-CM

## 2019-07-27 DIAGNOSIS — M7062 Trochanteric bursitis, left hip: Secondary | ICD-10-CM | POA: Diagnosis not present

## 2019-07-27 MED ORDER — MELOXICAM 15 MG PO TABS: 15.0000 mg | ORAL_TABLET | Freq: Every day | ORAL | 2 refills | Status: DC

## 2019-07-27 NOTE — Patient Instructions (Signed)
Iliotibial Band Syndrome  Iliotibial band syndrome (ITBS) is a condition that often causes knee pain. It can also cause pain in the outside of your hip, thigh, and knee. The iliotibial band is a strip of tissue that runs from the outside of your hip and down your thigh to the outside of your knee. Repeatedly bending and straightening your knee can irritate the iliotibial band. What are the causes? This condition is caused by inflammation and irritation from the friction of the iliotibial band moving over the thigh bone (femur) when you repeatedly bend and straighten your knee. What increases the risk? This condition is more likely to develop in people who:  Frequently change elevation during their workouts.  Run very [place] distances.  Recently increased the length or intensity of their workouts.  Run downhill often, or just started running downhill.  Ride a bike very far or often. You may also be at greater risk if you start a new workout routine without first warming up or if you have a job that requires you to bend, squat, or climb frequently. What are the signs or symptoms? Symptoms of this condition include:  Pain along the outside of your knee that may be worse with activity, especially running or going up and down stairs.  A "snapping" sensation over your knee.  Swelling on the outside of your knee.  Pain or a feeling of tightness in your hip. How is this diagnosed? This condition is diagnosed based on your symptoms, medical history, and physical exam. You may also see a health care provider who specializes in reducing pain and increasing mobility (physical therapist). A physical therapist may do an exam to check your balance, movement, and way of walking or running (gait) to see whether the way you move could contribute to your injury. You may also have tests to measure your strength, flexibility, and range of motion. How is this treated? Treatment for this condition  includes:  Resting and limiting exercise.  Returning to activities gradually.  Doing range-of-motion and strengthening exercises (physical therapy) as told by your health care provider.  Including low-impact activities, such as swimming, in your exercise routine. Follow these instructions at home:  If directed, apply ice to the injured area. ? Put ice in a plastic bag. ? Place a towel between your skin and the bag. ? Leave the ice on for 20 minutes, 2-3 times per day.  Return to your normal activities as told by your health care provider. Ask your health care provider what activities are safe for you.  Keep all follow-up visits with your health care provider. This is important. Contact a health care provider if:  Your pain does not improve or gets worse despite treatment. This information is not intended to replace advice given to you by your health care provider. Make sure you discuss any questions you have with your health care provider. Document Revised: 03/18/2017 Document Reviewed: 05/07/2016 Elsevier Patient Education  2020 [employer] Snapping Hip Syndrome  Snapping hip syndrome is a condition that causes a feeling like a snap or a pop in your hip, especially when you walk, stand up from a chair, or swing your leg. Snapping hip syndrome can affect different areas of your hip, including the front, side, or back. Strong bands of tissue (tendons) attach the muscles in your buttocks, thighs, and pelvis to the bones of your hip. Snapping hip syndrome typically happens when a muscle or tendon moves across a bony part of your hip. Snapping  hip can also involve torn or loose structures within the joint. What are the causes? This condition is usually caused by tight tendons or muscles around the hip. This often happens from overuse. What increases the risk? The following factors may make you more likely to develop this condition:  Being 36-19 years old.  Being a Tourist information centre manager, runner,  weight lifter, gymnast, or soccer player.  Having had an injury to your hip or pelvis.  Having an abnormally shaped pelvis or leg (deformity). What are the signs or symptoms? Symptoms of this condition include:  A sensation like a snap or a pop in the front, side, or back of the hip when moving your leg. This can cause pain. The pain typically goes away when you stop moving.  Tightness in the hip.  Swelling in the front or side of the hip.  Leg weakness, especially when trying to lift your leg upward or sideways.  Difficulty getting out of low chairs. How is this diagnosed? This condition is diagnosed based on your symptoms, medical history, and a physical exam.  Your health care provider may have you move your leg into certain positions and then test your muscle strength.  You may have tests to rule out other causes of pain. These may include an X-ray, an ultrasound, or an MRI.  You may also have an injection of a numbing medicine (lidocaine) to see if that reduces your symptoms. How is this treated? Treatment for this condition includes:  Stopping all activities that cause pain or make your condition worse.  Icing your hip to relieve pain.  Taking NSAIDs to reduce pain or having injections of medicine to reduce swelling (corticosteroids).  Doing range-of-motion and strengthening exercises (physical therapy) as told by your health care provider. You may need surgery to loosen your muscle and tendon or to remove any loose pieces of cartilage, if this applies. Follow these instructions at home: Managing pain, stiffness, and swelling   If directed, put ice on the injured area. ? Put ice in a plastic bag. ? Place a towel between your skin and the bag. ? Leave the ice on for 20 minutes, 2-3 times a day. General instructions  Take over-the-counter and prescription medicines only as told by your health care provider.  Return to your normal activities as told by your health  care provider. Ask your health care provider what activities are safe for you.  Do physical therapy as told by your health care provider.  Keep all follow-up visits as told by your health care provider. This is important. How is this prevented?  Warm up and stretch before being active.  Cool down and stretch after being active.  Give your body time to rest between periods of activity.  Maintain physical fitness, including: ? Strength. ? Flexibility. Contact a health care provider if:  You have pain, swelling, and difficulty moving that gets worse.  Your leg or hip seems weak and feels like it cannot hold you up.  You cannot stand or walk without severe pain. Summary  Snapping hip syndrome is a condition that causes a feeling like a snap or a pop in your hip, especially when you walk, stand up from a chair, or swing your leg.  This condition is usually caused by tight tendons or muscles around the hip.  It is treated by stopping activities that make it worse, icing, taking medicines, doing physical therapy, and having surgery if needed. This information is not intended to replace advice given  to you by your health care provider. Make sure you discuss any questions you have with your health care provider. Document Revised: 08/01/2018 Document Reviewed: 01/19/2018 Elsevier Patient Education  2020 [employer].

## 2019-07-27 NOTE — Progress Notes (Signed)
Date:  07/27/2019   Name:  Ebony Harris   DOB:  July 22, 1955   MRN:  [id number]   Chief Complaint: Knee Pain (gets down on knees at work, hurts to get up off ground- feels a pain in L) thigh)  Knee Pain  The incident occurred more than 1 week ago. The incident occurred at work. There was no injury mechanism. The pain is present in the left leg, left hip and left knee. The pain is moderate. The pain has been fluctuating since onset. Pertinent negatives include no inability to bear weight, loss of motion or numbness. The symptoms are aggravated by movement and palpation. She has tried acetaminophen for the symptoms. The treatment provided mild relief.  Leg Pain  Pertinent negatives include no inability to bear weight, loss of motion or numbness.    Lab Results  Component Value Date   CREATININE 0.88 03/19/2019   BUN 19 03/19/2019   NA 141 03/19/2019   K 4.4 03/19/2019   CL 101 03/19/2019   CO2 26 03/19/2019   Lab Results  Component Value Date   CHOL 178 03/19/2019   HDL 58 03/19/2019   LDLCALC 99 03/19/2019   TRIG 117 03/19/2019   CHOLHDL 4.6 (H) 09/30/2017   No results found for: TSH No results found for: HGBA1C No results found for: WBC, HGB, HCT, MCV, PLT Lab Results  Component Value Date   ALT 39 (H) 05/03/2019   AST 30 05/03/2019   ALKPHOS 95 05/03/2019   BILITOT 0.3 05/03/2019     Review of Systems  Constitutional: Negative.  Negative for chills, fatigue, fever and unexpected weight change.  HENT: Negative for congestion, ear discharge, ear pain, rhinorrhea, sinus pressure, sneezing and sore throat.   Eyes: Negative for photophobia, pain, discharge, redness and itching.  Respiratory: Negative for cough, shortness of breath, wheezing and stridor.   Gastrointestinal: Negative for abdominal pain, blood in stool, constipation, diarrhea, nausea and vomiting.  Endocrine: Negative for cold intolerance, heat intolerance, polydipsia, polyphagia and polyuria.  Genitourinary:  Negative for dysuria, flank pain, frequency, hematuria, menstrual problem, pelvic pain, urgency, vaginal bleeding and vaginal discharge.  Musculoskeletal: Negative for arthralgias, back pain and myalgias.  Skin: Negative for rash.  Allergic/Immunologic: Negative for environmental allergies and food allergies.  Neurological: Negative for dizziness, weakness, light-headedness, numbness and headaches.  Hematological: Negative for adenopathy. Does not bruise/bleed easily.  Psychiatric/Behavioral: Negative for dysphoric mood. The patient is not nervous/anxious.     Patient Active Problem List   Diagnosis Date Noted  . Mixed hyperlipidemia 09/25/2018  . Cyst of breast 08/30/2018  . Lichen sclerosus 08/65/7846  . Special screening for malignant neoplasms, colon   . Rectal polyp   . Allergic rhinitis 09/26/2015  . Herpes simplex 09/24/2015  . Hypertension 11/26/2014    No Known Allergies  Past Surgical History:  Procedure Laterality Date  . ABDOMINAL HYSTERECTOMY     partial  . BREAST BIOPSY Left    03/2014 negative  . BREAST BIOPSY Left    1992 negative  . BREAST CYST ASPIRATION Left    negative  . COLONOSCOPY WITH PROPOFOL N/A 02/09/2016   Procedure: COLONOSCOPY WITH PROPOFOL;  Surgeon: Lucilla Lame, MD;  Location: [facility];  Service: Endoscopy;  Laterality: N/A;  . POLYPECTOMY  02/09/2016   Procedure: POLYPECTOMY;  Surgeon: Lucilla Lame, MD;  Location: [facility];  Service: Endoscopy;;    Social History   Tobacco Use  . 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 Smokeless tobacco: Never Used  Substance Use Topics  . Alcohol use: Yes    Alcohol/week: 14.0 standard drinks    Types: 14 Glasses of wine per week  . Drug use: No     Medication list has been reviewed and updated.  Current Meds  Medication Sig  . ALPRAZolam (XANAX) 0.25 MG tablet Take 1 tablet (0.25 mg total) by mouth 2 (two) times daily as  needed for anxiety.  Marland Kitchen aspirin EC 81 MG tablet Take 81 mg by mouth daily.  Marland Kitchen atorvastatin (LIPITOR) 10 MG tablet Take 1 tablet (10 mg total) by mouth daily.  . Calcium Carbonate-Vit D-Min (CALTRATE 600+D PLUS MINERALS PO) Take 1 tablet by mouth daily.  . Clobetasol Propionate 0.05 % lotion Apply to affected areas daily for one week when having symptoms.  Marland Kitchen lisinopril-hydrochlorothiazide (ZESTORETIC) 20-12.5 MG tablet Take 1 tablet by mouth daily.  . Multiple Vitamins-Minerals (MULTIVITAMIN WITH MINERALS) tablet Take 1 tablet by mouth daily.  . traZODone (DESYREL) 50 MG tablet Take 0.5-1 tablets (25-50 mg total) by mouth at bedtime as needed for sleep.    PHQ 2/9 Scores 07/27/2019 09/25/2018 12/15/2017 09/30/2017  PHQ - 2 Score 0 0 0 0  PHQ- 9 Score 0 0 0 2    BP Readings from Last 3 Encounters:  07/27/19 126/90  03/19/19 138/80  01/15/19 140/90    Physical Exam Vitals and nursing note reviewed.  Constitutional:      General: She is not in acute distress.    Appearance: She is not diaphoretic.  HENT:     Head: Normocephalic and atraumatic.     Right Ear: External ear normal.     Left Ear: External ear normal.     Nose: Nose normal.  Eyes:     General:        Right eye: No discharge.        Left eye: No discharge.     Conjunctiva/sclera: Conjunctivae normal.     Pupils: Pupils are equal, round, and reactive to light.  Neck:     Thyroid: No thyromegaly.     Vascular: No JVD.  Cardiovascular:     Rate and Rhythm: Normal rate and regular rhythm.     Heart sounds: Normal heart sounds. No murmur. No friction rub. No gallop.   Pulmonary:     Effort: Pulmonary effort is normal.     Breath sounds: Normal breath sounds.  Abdominal:     General: Bowel sounds are normal.     Palpations: Abdomen is soft. There is no mass.     Tenderness: There is no abdominal tenderness. There is no guarding.  Musculoskeletal:        General: Normal range of motion.     Cervical back: Normal range of  motion and neck supple.     Right upper leg: Normal.     Left upper leg: Tenderness present.     Comments: Tender of trochanteric bursa/snapping/ tenderover lateral aspect left knee  Lymphadenopathy:     Cervical: No cervical adenopathy.  Skin:    General: Skin is warm and dry.  Neurological:     Mental Status: She is alert.     Deep Tendon Reflexes: Reflexes are normal and symmetric.     Wt Readings from Last 3 Encounters:  07/27/19 161 lb (73 kg)  03/19/19 165 lb (74.8 kg)  01/15/19 165 lb (74.8 kg)    BP 126/90  Pulse 80   Ht 5\' 6"  (1.676 m)   Wt 161 lb (73 kg)   BMI 25.99 kg/m   Assessment and Plan:  1. Trochanteric bursitis of left hip New onset.  Persistent.  Tender is over the trochanteric bursa.  This is consistent with a bursitis and we will initiate meloxicam 15 mg daily with acetaminophen as needed and ice in the evenings after work. - meloxicam (MOBIC) 15 MG tablet; Take 1 tablet (15 mg total) by mouth daily.  Dispense: 30 tablet; Refill: 2  2. Iliotibial band syndrome of left side This would explain the snapping sensation that he is on the lateral aspect of the left knee.  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  In the meantime we will initiate meloxicam 15 mg once a day.  She will follow this with Tylenol throughout the day as needed for additional pain relief as well as icing the area down in the evenings after work.  That will be involving orthopedics if this should continue despite anti-inflammatory therapy. - meloxicam (MOBIC) 15 MG tablet; Take 1 tablet (15 mg total) by mouth daily.  Dispense: 30 tablet; Refill: 2

## 2019-07-30 ENCOUNTER — Ambulatory Visit: Payer: BC Managed Care – PPO | Admitting: Family Medicine

## 2019-08-27 ENCOUNTER — Ambulatory Visit: Payer: BC Managed Care – PPO | Admitting: Family Medicine

## 2019-08-27 ENCOUNTER — Encounter: Payer: Self-pay | Admitting: Family Medicine

## 2019-08-27 ENCOUNTER — Other Ambulatory Visit: Payer: Self-pay

## 2019-08-27 VITALS — BP 130/60 | HR 80 | Ht 66.0 in | Wt 152.0 lb

## 2019-08-27 DIAGNOSIS — I10 Essential (primary) hypertension: Secondary | ICD-10-CM

## 2019-08-27 DIAGNOSIS — E782 Mixed hyperlipidemia: Secondary | ICD-10-CM | POA: Diagnosis not present

## 2019-08-27 MED ORDER — LISINOPRIL-HYDROCHLOROTHIAZIDE 20-12.5 MG PO TABS: 1.0000 | ORAL_TABLET | Freq: Every day | ORAL | 1 refills | Status: DC

## 2019-08-27 MED ORDER — ATORVASTATIN CALCIUM 10 MG PO TABS: 10.0000 mg | ORAL_TABLET | Freq: Every day | ORAL | 1 refills | Status: DC

## 2019-08-27 NOTE — Progress Notes (Signed)
Date:  08/27/2019   Name:  Ebony Harris   DOB:  Sep 03, 1955   MRN:  [id number]   Chief Complaint: Hypertension, Hyperlipidemia, and Insomnia (takes trazodone as needed)  Hypertension This is a chronic problem. The current episode started more than 1 year ago. The problem has been gradually improving since onset. The problem is controlled. Pertinent negatives include no anxiety, blurred vision, chest pain, headaches, malaise/fatigue, neck pain, orthopnea, palpitations, peripheral edema, PND, shortness of breath or sweats. There are no associated agents to hypertension. Risk factors for coronary artery disease include dyslipidemia. Past treatments include ACE inhibitors and diuretics. The current treatment provides moderate improvement. There are no compliance problems.  There is no history of angina, kidney disease, CAD/MI, CVA, heart failure, left ventricular hypertrophy, PVD or retinopathy. There is no history of chronic renal disease, a hypertension causing med or renovascular disease.  Hyperlipidemia This is a chronic problem. The current episode started more than 1 year ago. The problem is controlled. Recent lipid tests were reviewed and are normal. She has no history of chronic renal disease, diabetes, hypothyroidism, liver disease, obesity or nephrotic syndrome. Factors aggravating her hyperlipidemia include thiazides. Pertinent negatives include no chest pain, focal sensory loss, focal weakness, leg pain, myalgias or shortness of breath. Current antihyperlipidemic treatment includes statins. The current treatment provides moderate improvement of lipids. There are no compliance problems.  Risk factors for coronary artery disease include hypertension and dyslipidemia.  Insomnia Primary symptoms: no fragmented sleep, no sleep disturbance, no difficulty falling asleep, no somnolence, no frequent awakening, no premature morning awakening, no malaise/fatigue, no napping.   The onset quality is  gradual. The problem occurs intermittently. The problem has been gradually improving since onset.    Lab Results  Component Value Date   CREATININE 0.88 03/19/2019   BUN 19 03/19/2019   NA 141 03/19/2019   K 4.4 03/19/2019   CL 101 03/19/2019   CO2 26 03/19/2019   Lab Results  Component Value Date   CHOL 178 03/19/2019   HDL 58 03/19/2019   LDLCALC 99 03/19/2019   TRIG 117 03/19/2019   CHOLHDL 4.6 (H) 09/30/2017   No results found for: TSH No results found for: HGBA1C No results found for: WBC, HGB, HCT, MCV, PLT Lab Results  Component Value Date   ALT 39 (H) 05/03/2019   AST 30 05/03/2019   ALKPHOS 95 05/03/2019   BILITOT 0.3 05/03/2019     Review of Systems  Constitutional: Negative.  Negative for chills, fatigue, fever, malaise/fatigue and unexpected weight change.  HENT: Negative for congestion, ear discharge, ear pain, rhinorrhea, sinus pressure, sneezing and sore throat.   Eyes: Negative for blurred vision, photophobia, pain, discharge, redness and itching.  Respiratory: Negative for cough, shortness of breath, wheezing and stridor.   Cardiovascular: Negative for chest pain, palpitations, orthopnea and PND.  Gastrointestinal: Negative for abdominal pain, blood in stool, constipation, diarrhea, nausea and vomiting.  Endocrine: Negative for cold intolerance, heat intolerance, polydipsia, polyphagia and polyuria.  Genitourinary: Negative for dysuria, flank pain, frequency, hematuria, menstrual problem, pelvic pain, urgency, vaginal bleeding and vaginal discharge.  Musculoskeletal: Negative for arthralgias, back pain, myalgias and neck pain.  Skin: Negative for rash.  Allergic/Immunologic: Negative for environmental allergies and food allergies.  Neurological: Negative for dizziness, focal weakness, weakness, light-headedness, numbness and headaches.  Hematological: Negative for adenopathy. Does not bruise/bleed easily.  Psychiatric/Behavioral: Negative for dysphoric  mood and sleep disturbance. The patient has insomnia. The patient is not nervous/anxious.  Patient Active Problem List   Diagnosis Date Noted  . Mixed hyperlipidemia 09/25/2018  . Cyst of breast 08/30/2018  . Lichen sclerosus 01/11/2018  . Special screening for malignant neoplasms, colon   . Rectal polyp   . Allergic rhinitis 09/26/2015  . Herpes simplex 09/24/2015  . Hypertension 11/26/2014    No Known Allergies  Past Surgical History:  Procedure Laterality Date  . ABDOMINAL HYSTERECTOMY     partial  . BREAST BIOPSY Left    03/2014 negative  . BREAST BIOPSY Left    1992 negative  . BREAST CYST ASPIRATION Left    negative  . COLONOSCOPY WITH PROPOFOL N/A 02/09/2016   Procedure: COLONOSCOPY WITH PROPOFOL;  Surgeon: Midge Minium, MD;  [facility];  Service: Endoscopy;  Laterality: N/A;  . POLYPECTOMY  02/09/2016   Procedure: POLYPECTOMY;  Surgeon: Midge Minium, MD;  Location: [facility];  Service: Endoscopy;;    Social History   Tobacco Use  . Smoking status: Former Smoker    Quit date: 04/19/1980    Years since quitting: 39.3  . Smokeless tobacco: Never Used  Substance Use Topics  . Alcohol use: Yes    Alcohol/week: 14.0 standard drinks    Types: 14 Glasses of wine per week  . Drug use: No     Medication list has been reviewed and updated.  Current Meds  Medication Sig  . aspirin EC 81 MG tablet Take 81 mg by mouth daily.  Marland Kitchen atorvastatin (LIPITOR) 10 MG tablet Take 1 tablet (10 mg total) by mouth daily.  . Calcium Carbonate-Vit D-Min (CALTRATE 600+D PLUS MINERALS PO) Take 1 tablet by mouth daily.  . Clobetasol Propionate 0.05 % lotion Apply to affected areas daily for one week when having symptoms.  Marland Kitchen lisinopril-hydrochlorothiazide (ZESTORETIC) 20-12.5 MG tablet Take 1 tablet by mouth daily.  . Multiple Vitamins-Minerals (MULTIVITAMIN WITH MINERALS) tablet Take 1 tablet by mouth daily.    PHQ 2/9 Scores 08/27/2019 07/27/2019 09/25/2018  12/15/2017  PHQ - 2 Score 0 0 0 0  PHQ- 9 Score 0 0 0 0    BP Readings from Last 3 Encounters:  08/27/19 130/60  07/27/19 126/90  03/19/19 138/80    Physical Exam Vitals and nursing note reviewed.  Constitutional:      General: She is not in acute distress.    Appearance: She is not diaphoretic.  HENT:     Head: Normocephalic and atraumatic.     Right Ear: Tympanic membrane, ear canal and external ear normal. There is no impacted cerumen.     Left Ear: Tympanic membrane, ear canal and external ear normal. There is no impacted cerumen.     Nose: Nose normal. No congestion or rhinorrhea.     Mouth/Throat:     Mouth: Mucous membranes are moist.  Eyes:     General:        Right eye: No discharge.        Left eye: No discharge.     Conjunctiva/sclera: Conjunctivae normal.     Pupils: Pupils are equal, round, and reactive to light.  Neck:     Thyroid: No thyromegaly.     Vascular: No JVD.  Cardiovascular:     Rate and Rhythm: Normal rate and regular rhythm.     Heart sounds: Normal heart sounds. No murmur. No friction rub. No gallop.   Pulmonary:     Effort: Pulmonary effort is normal.     Breath sounds: Normal breath sounds. No wheezing, rhonchi or  rales.  Abdominal:     General: Bowel sounds are normal.     Palpations: Abdomen is soft. There is no hepatomegaly, splenomegaly or mass.     Tenderness: There is no abdominal tenderness. There is no guarding.  Musculoskeletal:        General: Normal range of motion.     Cervical back: Normal range of motion and neck supple.  Lymphadenopathy:     Cervical: No cervical adenopathy.  Skin:    General: Skin is warm and dry.  Neurological:     Mental Status: She is alert.     Deep Tendon Reflexes: Reflexes are normal and symmetric.     Wt Readings from Last 3 Encounters:  08/27/19 152 lb (68.9 kg)  07/27/19 161 lb (73 kg)  03/19/19 165 lb (74.8 kg)    BP 130/60   Pulse 80   Ht 5\' 6"  (1.676 m)   Wt 152 lb (68.9 kg)    BMI 24.53 kg/m   Assessment and Plan: 1. Essential hypertension Chronic.  Controlled.  Stable.  Continue lisinopril hydrochlorothiazide 20-12.51 a day.  Will check CMP to evaluate electrolytes/GFR. - lisinopril-hydrochlorothiazide (ZESTORETIC) 20-12.5 MG tablet; Take 1 tablet by mouth daily.  Dispense: 90 tablet; Refill: 1 - Comprehensive metabolic panel  2. Mixed hyperlipidemia Chronic.  Controlled.  Stable.  Last lipid readings have been normal on current dosing of atorvastatin 10 mg once a day.  Last time it was noted that there was an elevation in the transaminases which may be secondary to the statin we will check LFTs with CMP today and if elevated will have consideration of whether or not to consider discontinuance of statin. - atorvastatin (LIPITOR) 10 MG tablet; Take 1 tablet (10 mg total) by mouth daily.  Dispense: 90 tablet; Refill: 1

## 2019-08-28 LAB — COMPREHENSIVE METABOLIC PANEL
ALT: 36 IU/L — ABNORMAL HIGH (ref 0–32)
AST: 34 IU/L (ref 0–40)
Albumin/Globulin Ratio: 2.3 — ABNORMAL HIGH (ref 1.2–2.2)
Albumin: 4.6 g/dL (ref 3.8–4.8)
Alkaline Phosphatase: 90 IU/L (ref 39–117)
BUN/Creatinine Ratio: 27 (ref 12–28)
BUN: 23 mg/dL (ref 8–27)
Bilirubin Total: 0.5 mg/dL (ref 0.0–1.2)
CO2: 24 mmol/L (ref 20–29)
Calcium: 8.9 mg/dL (ref 8.7–10.3)
Chloride: 103 mmol/L (ref 96–106)
Creatinine, Ser: 0.85 mg/dL (ref 0.57–1.00)
GFR calc Af Amer: 84 mL/min/{1.73_m2} (ref >59)
GFR calc non Af Amer: 73 mL/min/{1.73_m2} (ref >59)
Globulin, Total: 2 g/dL (ref 1.5–4.5)
Glucose: 97 mg/dL (ref 65–99)
Potassium: 4.3 mmol/L (ref 3.5–5.2)
Sodium: 143 mmol/L (ref 134–144)
Total Protein: 6.6 g/dL (ref 6.0–8.5)

## 2020-01-02 ENCOUNTER — Other Ambulatory Visit: Payer: Self-pay | Admitting: Family Medicine

## 2020-01-02 DIAGNOSIS — N6310 Unspecified lump in the right breast, unspecified quadrant: Secondary | ICD-10-CM

## 2020-01-21 ENCOUNTER — Encounter: Payer: Self-pay | Admitting: Obstetrics & Gynecology

## 2020-01-21 ENCOUNTER — Ambulatory Visit (INDEPENDENT_AMBULATORY_CARE_PROVIDER_SITE_OTHER): Payer: Medicare Other | Admitting: Obstetrics & Gynecology

## 2020-01-21 ENCOUNTER — Other Ambulatory Visit: Payer: Self-pay

## 2020-01-21 VITALS — BP 142/80 | Ht 66.0 in | Wt 158.0 lb

## 2020-01-21 DIAGNOSIS — Z01419 Encounter for gynecological examination (general) (routine) without abnormal findings: Secondary | ICD-10-CM

## 2020-01-21 DIAGNOSIS — L900 Lichen sclerosus et atrophicus: Secondary | ICD-10-CM

## 2020-01-21 DIAGNOSIS — Z1211 Encounter for screening for malignant neoplasm of colon: Secondary | ICD-10-CM | POA: Diagnosis not present

## 2020-01-21 MED ORDER — CLOBETASOL PROPIONATE 0.05 % EX LOTN: TOPICAL_LOTION | CUTANEOUS | 3 refills | Status: DC

## 2020-01-21 NOTE — Patient Instructions (Signed)
PAP every three years Mammogram every year    Call [phone] to schedule at [facility] Colonoscopy every 10 years Labs yearly (with PCP)  Thank you for choosing [facility]. As part of our ongoing efforts to improve patient experience, we would appreciate your feedback. Please fill out the short survey that you will receive by mail or MyChart. Your opinion is important to us! - Dr. Venida Tsukamoto   

## 2020-01-21 NOTE — Progress Notes (Signed)
HPI:      Ebony Harris is a 64 y.o. [phone] who LMP was in the past, she presents today for her annual examination.  The patient has no complaints today.  She uses Clobetasol for flare ups of LS about 2-3 times per year.  The patient is sexually active. Herlast pap: approximate date 2017 and was normal and last mammogram: approximate date 2020 and was normal.  The patient does perform self breast exams.  There is notable family history of breast cancer in her family. The patient is not taking hormone replacement therapy. Patient denies post-menopausal vaginal bleeding.   The patient has regular exercise: yes. The patient denies current symptoms of depression.    GYN Hx: Last Colonoscopy:5 years ago. Normal.  Last DEXA: never ago.    PMHx: Past Medical History:  Diagnosis Date  . Family history of breast cancer    daughter was BRCA neg; pt qualifies for My Risk testing; letter sent 5/18  . Fibrocystic breast changes   . Heart murmur   . Hypertension   . Lichen sclerosus   . Wears contact lenses    Past Surgical History:  Procedure Laterality Date  . ABDOMINAL HYSTERECTOMY     partial  . BREAST BIOPSY Left    03/2014 negative  . BREAST BIOPSY Left    1992 negative  . BREAST CYST ASPIRATION Left    negative  . COLONOSCOPY WITH PROPOFOL N/A 02/09/2016   Procedure: COLONOSCOPY WITH PROPOFOL;  Surgeon: Lucilla Lame, MD;  Location: [facility];  Service: Endoscopy;  Laterality: N/A;  . POLYPECTOMY  02/09/2016   Procedure: POLYPECTOMY;  Surgeon: Lucilla Lame, MD;  Location: [place];  Service: Endoscopy;;   Family History  Problem Relation Age of Onset  . Breast cancer Daughter 2       died at 31 with mets; BRCA neg  . Heart disease Father   . Hypertension Father   . Breast cancer Paternal Aunt 52  . Breast cancer Other        age 11, 18    Social History   Tobacco Use  . Smoking status: Former Smoker    Quit date: 04/19/1980    Years since quitting: 39.7    . Smokeless tobacco: Never Used  Vaping Use  . Vaping Use: Never used  Substance Use Topics  . Alcohol use: Yes    Alcohol/week: 14.0 standard drinks    Types: 14 Glasses of wine per week  . Drug use: No    Current Outpatient Medications:  .  aspirin EC 81 MG tablet, Take 81 mg by mouth daily., Disp: , Rfl:  .  Calcium Carbonate-Vit D-Min (CALTRATE 600+D PLUS MINERALS PO), Take 1 tablet by mouth daily., Disp: , Rfl:  .  Clobetasol Propionate 0.05 % lotion, Apply to affected areas daily for one week when having symptoms., Disp: 59 mL, Rfl: 3 .  meloxicam (MOBIC) 15 MG tablet, Take 1 tablet (15 mg total) by mouth daily., Disp: 30 tablet, Rfl: 2 .  Multiple Vitamins-Minerals (MULTIVITAMIN WITH MINERALS) tablet, Take 1 tablet by mouth daily., Disp: , Rfl:  .  lisinopril-hydrochlorothiazide (ZESTORETIC) 20-12.5 MG tablet, Take 1 tablet by mouth daily. (Patient not taking: Reported on 01/21/2020), Disp: 90 tablet, Rfl: 1 Allergies: Patient has no known allergies.  Review of Systems  Constitutional: Negative for chills, fever and malaise/fatigue.  HENT: Negative for congestion, sinus pain and sore throat.   Eyes: Negative for blurred vision and pain.  Respiratory: Negative  for cough and wheezing.   Cardiovascular: Negative for chest pain and leg swelling.  Gastrointestinal: Negative for abdominal pain, constipation, diarrhea, heartburn, nausea and vomiting.  Genitourinary: Negative for dysuria, frequency, hematuria and urgency.  Musculoskeletal: Negative for back pain, joint pain, myalgias and neck pain.  Skin: Negative for itching and rash.  Neurological: Negative for dizziness, tremors and weakness.  Endo/Heme/Allergies: Does not bruise/bleed easily.  Psychiatric/Behavioral: Negative for depression. The patient is not nervous/anxious and does not have insomnia.     Objective: BP (!) 142/80   Ht '5\' 6"'  (1.676 m)   Wt 158 lb (71.7 kg)   BMI 25.50 kg/m   Filed Weights   01/21/20 0931   Weight: 158 lb (71.7 kg)   Body mass index is 25.5 kg/m. Physical Exam Constitutional:      General: She is not in acute distress.    Appearance: She is well-developed.  Genitourinary:     Pelvic exam was performed with patient supine.     Vulva, urethra, bladder, vagina and rectum normal.     No lesions in the vagina.     No vaginal bleeding.     Cervix is absent.     Uterus is absent.     No right or left adnexal mass present.     Right adnexa not tender.     Left adnexa not tender.     Genitourinary Comments: Vaginal cuff well healed  HENT:     Head: Normocephalic and atraumatic. No laceration.     Right Ear: Hearing normal.     Left Ear: Hearing normal.     Mouth/Throat:     Pharynx: Uvula midline.  Eyes:     Pupils: Pupils are equal, round, and reactive to light.  Neck:     Thyroid: No thyromegaly.  Cardiovascular:     Rate and Rhythm: Normal rate and regular rhythm.     Heart sounds: No murmur heard.  No friction rub. No gallop.   Pulmonary:     Effort: Pulmonary effort is normal. No respiratory distress.     Breath sounds: Normal breath sounds. No wheezing.  Chest:     Breasts:        Right: No mass, skin change or tenderness.        Left: No mass, skin change or tenderness.  Abdominal:     General: Bowel sounds are normal. There is no distension.     Palpations: Abdomen is soft.     Tenderness: There is no abdominal tenderness. There is no rebound.  Musculoskeletal:        General: Normal range of motion.     Cervical back: Normal range of motion and neck supple.  Neurological:     Mental Status: She is alert and oriented to person, place, and time.     Cranial Nerves: No cranial nerve deficit.  Skin:    General: Skin is warm and dry.  Psychiatric:        Judgment: Judgment normal.  Vitals reviewed.     Assessment: Annual Exam 1. Women's annual routine gynecological examination   2. Lichen sclerosus   3. Screen for colon cancer     Plan:             1.  Vaginal Screening-  Pap smear schedule reviewed with patient  2. Breast screening- Exam annually and mammogram scheduled  3. Colonoscopy every 10 years, Hemoccult testing after age 20  4. Labs managed by PCP  5. Counseling  for hormonal therapy: none              6. FRAX - FRAX score for assessing the 10 year probability for fracture calculated and discussed today.  Based on age and score today, DEXA is not currently scheduled.  Recommended.   7. Lichen Sclerosus, cont PRN use of Clobetasol    F/U  Return in about 1 year (around 01/20/2021) for Annual.  Barnett Applebaum, MD, [facility], [facility] 01/21/2020  9:59 AM

## 2020-01-24 ENCOUNTER — Encounter: Payer: Self-pay | Admitting: Obstetrics and Gynecology

## 2020-02-04 ENCOUNTER — Ambulatory Visit
Admission: RE | Admit: 2020-02-04 | Discharge: 2020-02-04 | Disposition: A | Discharge status: Home / Self Care | Payer: Medicare Other | Source: Ambulatory Visit | Attending: Family Medicine | Admitting: Family Medicine

## 2020-02-04 ENCOUNTER — Other Ambulatory Visit: Payer: Self-pay

## 2020-02-04 DIAGNOSIS — N6310 Unspecified lump in the right breast, unspecified quadrant: Secondary | ICD-10-CM | POA: Diagnosis present

## 2020-02-04 DIAGNOSIS — N6311 Unspecified lump in the right breast, upper outer quadrant: Secondary | ICD-10-CM | POA: Diagnosis not present

## 2020-02-04 DIAGNOSIS — Z1231 Encounter for screening mammogram for malignant neoplasm of breast: Secondary | ICD-10-CM | POA: Insufficient documentation

## 2020-02-04 LAB — HM MAMMOGRAPHY

## 2020-02-22 LAB — FECAL OCCULT BLOOD, IMMUNOCHEMICAL: Fecal Occult Bld: NEGATIVE

## 2020-03-03 ENCOUNTER — Other Ambulatory Visit: Payer: Self-pay

## 2020-03-03 ENCOUNTER — Encounter: Payer: Self-pay | Admitting: Family Medicine

## 2020-03-03 ENCOUNTER — Ambulatory Visit: Payer: Medicare Other | Admitting: Family Medicine

## 2020-03-03 VITALS — BP 120/68 | HR 76 | Ht 66.0 in | Wt 161.0 lb

## 2020-03-03 DIAGNOSIS — I10 Essential (primary) hypertension: Secondary | ICD-10-CM | POA: Diagnosis not present

## 2020-03-03 DIAGNOSIS — E782 Mixed hyperlipidemia: Secondary | ICD-10-CM | POA: Diagnosis not present

## 2020-03-03 DIAGNOSIS — R7401 Elevation of levels of liver transaminase levels: Secondary | ICD-10-CM | POA: Diagnosis not present

## 2020-03-03 MED ORDER — LISINOPRIL-HYDROCHLOROTHIAZIDE 20-12.5 MG PO TABS: 1.0000 | ORAL_TABLET | Freq: Every day | ORAL | 1 refills | Status: DC

## 2020-03-03 NOTE — Progress Notes (Signed)
Date:  03/03/2020   Name:  Ebony Harris   DOB:  [birth year]   MRN:  [id number]   Chief Complaint: Hyperlipidemia and Hypertension  Hyperlipidemia This is a chronic problem. The current episode started more than 1 year ago. The problem is controlled. Recent lipid tests were reviewed and are normal. She has no history of chronic renal disease, diabetes, hypothyroidism, liver disease, obesity or nephrotic syndrome. There are no known factors aggravating her hyperlipidemia. Pertinent negatives include no chest pain, focal sensory loss, focal weakness, leg pain, myalgias or shortness of breath. Current antihyperlipidemic treatment includes statins. The current treatment provides moderate improvement of lipids. There are no compliance problems.  Risk factors for coronary artery disease include dyslipidemia and hypertension.  Hypertension This is a chronic problem. The current episode started more than 1 year ago. The problem is controlled. Pertinent negatives include no anxiety, blurred vision, chest pain, headaches, malaise/fatigue, neck pain, orthopnea, palpitations, peripheral edema, PND, shortness of breath or sweats. Risk factors for coronary artery disease include dyslipidemia. Past treatments include diuretics and ACE inhibitors. The current treatment provides moderate improvement. There are no compliance problems.  There is no history of angina, kidney disease, CAD/MI, CVA, heart failure, left ventricular hypertrophy, PVD or retinopathy. There is no history of chronic renal disease, a hypertension causing med or renovascular disease.    Lab Results  Component Value Date   CREATININE 0.85 08/27/2019   BUN 23 08/27/2019   NA 143 08/27/2019   K 4.3 08/27/2019   CL 103 08/27/2019   CO2 24 08/27/2019   Lab Results  Component Value Date   CHOL 178 03/19/2019   HDL 58 03/19/2019   LDLCALC 99 03/19/2019   TRIG 117 03/19/2019   CHOLHDL 4.6 (H) 09/30/2017   No results found for: TSH No  results found for: HGBA1C No results found for: WBC, HGB, HCT, MCV, PLT Lab Results  Component Value Date   ALT 36 (H) 08/27/2019   AST 34 08/27/2019   ALKPHOS 90 08/27/2019   BILITOT 0.5 08/27/2019     Review of Systems  Constitutional: Negative.  Negative for chills, fatigue, fever, malaise/fatigue and unexpected weight change.  HENT: Negative for congestion, ear discharge, ear pain, rhinorrhea, sinus pressure, sneezing and sore throat.   Eyes: Negative for blurred vision, photophobia, pain, discharge, redness and itching.  Respiratory: Negative for cough, shortness of breath, wheezing and stridor.   Cardiovascular: Negative for chest pain, palpitations, orthopnea and PND.  Gastrointestinal: Negative for abdominal pain, blood in stool, constipation, diarrhea, nausea and vomiting.  Endocrine: Negative for cold intolerance, heat intolerance, polydipsia, polyphagia and polyuria.  Genitourinary: Negative for dysuria, flank pain, frequency, hematuria, menstrual problem, pelvic pain, urgency, vaginal bleeding and vaginal discharge.  Musculoskeletal: Negative for arthralgias, back pain, myalgias and neck pain.  Skin: Negative for rash.  Allergic/Immunologic: Negative for environmental allergies and food allergies.  Neurological: Negative for dizziness, focal weakness, weakness, light-headedness, numbness and headaches.  Hematological: Negative for adenopathy. Does not bruise/bleed easily.  Psychiatric/Behavioral: Negative for dysphoric mood. The patient is not nervous/anxious.     Patient Active Problem List   Diagnosis Date Noted  . Mixed hyperlipidemia 09/25/2018  . Cyst of breast 08/30/2018  . Lichen sclerosus 01/11/2018  . Special screening for malignant neoplasms, colon   . Rectal polyp   . Allergic rhinitis 09/26/2015  . Herpes simplex 09/24/2015  . Hypertension 11/26/2014    No Known Allergies  Past Surgical History:  Procedure Laterality Date  . ABDOMINAL  HYSTERECTOMY      partial  . BREAST BIOPSY Left    03/2014 negative  . BREAST BIOPSY Left    1992 negative  . BREAST CYST ASPIRATION Left    negative  . COLONOSCOPY WITH PROPOFOL N/A 02/09/2016   Procedure: COLONOSCOPY WITH PROPOFOL;  Surgeon: Midge Minium, MD;  Location: [facility];  Service: Endoscopy;  Laterality: N/A;  . POLYPECTOMY  02/09/2016   Procedure: POLYPECTOMY;  Surgeon: Midge Minium, MD;  Location: [facility];  Service: Endoscopy;;    Social History   Tobacco Use  . Smoking status: Former Smoker    Quit date: 04/19/1980    Years since quitting: 39.8  . Smokeless tobacco: Never Used  Vaping Use  . Vaping Use: Never used  Substance Use Topics  . Alcohol use: Yes    Alcohol/week: 14.0 standard drinks    Types: 14 Glasses of wine per week  . Drug use: No     Medication list has been reviewed and updated.  Current Meds  Medication Sig  . aspirin EC 81 MG tablet Take 81 mg by mouth daily.  Marland Kitchen atorvastatin (LIPITOR) 10 MG tablet Take 10 mg by mouth daily.  . Calcium Carbonate-Vit D-Min (CALTRATE 600+D PLUS MINERALS PO) Take 1 tablet by mouth daily.  . Clobetasol Propionate 0.05 % lotion Apply to affected areas daily for one week when having symptoms.  Marland Kitchen lisinopril-hydrochlorothiazide (ZESTORETIC) 20-12.5 MG tablet Take 1 tablet by mouth daily.  . Multiple Vitamins-Minerals (MULTIVITAMIN WITH MINERALS) tablet Take 1 tablet by mouth daily.    PHQ 2/9 Scores 03/03/2020 08/27/2019 07/27/2019 09/25/2018  PHQ - 2 Score 0 0 0 0  PHQ- 9 Score 0 0 0 0    GAD 7 : Generalized Anxiety Score 03/03/2020 08/27/2019 07/27/2019  Nervous, Anxious, on Edge 0 0 0  Control/stop worrying 0 0 0  Worry too much - different things 0 0 0  Trouble relaxing 0 0 0  Restless 0 0 0  Easily annoyed or irritable 0 0 0  Afraid - awful might happen 0 0 0  Total GAD 7 Score 0 0 0    BP Readings from Last 3 Encounters:  03/03/20 120/68  01/21/20 (!) 142/80  08/27/19 130/60    Physical  Exam Vitals and nursing note reviewed.  Constitutional:      Appearance: She is well-developed.  HENT:     Head: Normocephalic.     Right Ear: External ear normal.     Left Ear: External ear normal.  Eyes:     General: Lids are everted, no foreign bodies appreciated. No scleral icterus.       Left eye: No foreign body or hordeolum.     Conjunctiva/sclera: Conjunctivae normal.     Right eye: Right conjunctiva is not injected.     Left eye: Left conjunctiva is not injected.     Pupils: Pupils are equal, round, and reactive to light.  Neck:     Thyroid: No thyromegaly.     Vascular: No JVD.     Trachea: No tracheal deviation.  Cardiovascular:     Rate and Rhythm: Normal rate and regular rhythm.     Heart sounds: Normal heart sounds. No murmur heard.  No friction rub. No gallop.   Pulmonary:     Effort: Pulmonary effort is normal. No respiratory distress.     Breath sounds: Normal breath sounds. No wheezing or rales.  Abdominal:     General: Bowel sounds are normal.  Palpations: Abdomen is soft. There is no mass.     Tenderness: There is no abdominal tenderness. There is no guarding or rebound.  Musculoskeletal:        General: No tenderness. Normal range of motion.     Cervical back: Normal range of motion and neck supple.  Lymphadenopathy:     Cervical: No cervical adenopathy.  Skin:    General: Skin is warm.     Findings: No rash.  Neurological:     Mental Status: She is alert and oriented to person, place, and time.     Cranial Nerves: No cranial nerve deficit.     Deep Tendon Reflexes: Reflexes normal.  Psychiatric:        Mood and Affect: Mood is not anxious or depressed.     Wt Readings from Last 3 Encounters:  03/03/20 161 lb (73 kg)  01/21/20 158 lb (71.7 kg)  08/27/19 152 lb (68.9 kg)    BP 120/68   Pulse 76   Ht 5\' 6"  (1.676 m)   Wt 161 lb (73 kg)   BMI 25.99 kg/m   Assessment and Plan:                                                    Patient's  chart was reviewed for previous encounters and labs/imaging most recent were also reviewed. 1. Essential hypertension Chronic.  Controlled.  Stable.  Blood pressure is 120/68.  Continue lisinopril hydrochlorothiazide 20-12 0.5 once a day will check renal function panel. - lisinopril-hydrochlorothiazide (ZESTORETIC) 20-12.5 MG tablet; Take 1 tablet by mouth daily.  Dispense: 90 tablet; Refill: 1 - Renal Function Panel  2. Elevated transaminase level Chronic.  Stable.  One transaminase was elevated last time but minimally with the other well in normal range.  Seen that we have continued statin agent we will recheck hepatic panel at this time. - Hepatic Function Panel (6)  3. Mixed hyperlipidemia Chronic.  Controlled.  Stable.  Review of previous lipid panel from 03/19/2019 noted normal LDL at 99.  Patient has continued her atorvastatin 10 mg and pending lipid panel and hepatic panel on whether this will be continued. - Lipid Panel With LDL/HDL Ratio

## 2020-03-03 NOTE — Patient Instructions (Signed)

## 2020-03-04 LAB — RENAL FUNCTION PANEL
Albumin: 4.4 g/dL (ref 3.8–4.8)
BUN/Creatinine Ratio: 26 (ref 12–28)
BUN: 21 mg/dL (ref 8–27)
CO2: 27 mmol/L (ref 20–29)
Calcium: 9.9 mg/dL (ref 8.7–10.3)
Chloride: 99 mmol/L (ref 96–106)
Creatinine, Ser: 0.81 mg/dL (ref 0.57–1.00)
GFR calc Af Amer: 88 mL/min/{1.73_m2} (ref >59)
GFR calc non Af Amer: 76 mL/min/{1.73_m2} (ref >59)
Glucose: 99 mg/dL (ref 65–99)
Phosphorus: 3.9 mg/dL (ref 3.0–4.3)
Potassium: 4.3 mmol/L (ref 3.5–5.2)
Sodium: 142 mmol/L (ref 134–144)

## 2020-03-04 LAB — HEPATIC FUNCTION PANEL (6)
ALT: 44 IU/L — ABNORMAL HIGH (ref 0–32)
AST: 27 IU/L (ref 0–40)
Alkaline Phosphatase: 104 IU/L (ref 44–121)
Bilirubin Total: 0.6 mg/dL (ref 0.0–1.2)
Bilirubin, Direct: 0.16 mg/dL (ref 0.00–0.40)

## 2020-03-04 LAB — LIPID PANEL WITH LDL/HDL RATIO
Cholesterol, Total: 183 mg/dL (ref 100–199)
HDL: 58 mg/dL (ref >39)
LDL Chol Calc (NIH): 101 mg/dL — ABNORMAL HIGH (ref 0–99)
LDL/HDL Ratio: 1.7 ratio (ref 0.0–3.2)
Triglycerides: 134 mg/dL (ref 0–149)
VLDL Cholesterol Cal: 24 mg/dL (ref 5–40)

## 2020-03-22 ENCOUNTER — Other Ambulatory Visit: Payer: Self-pay | Admitting: Family Medicine

## 2020-03-22 NOTE — Telephone Encounter (Signed)
Requested medication (s) are due for refill today: -  Requested medication (s) are on the active medication list: historical med  Last refill:  03/03/20  Future visit scheduled: yes  Notes to clinic:  historical med and provider   Requested Prescriptions  Pending Prescriptions Disp Refills   atorvastatin (LIPITOR) 10 MG tablet [Pharmacy Med Name: ATORVASTATIN 10 MG TABLET] 90 tablet 1    Sig: TAKE 1 TABLET BY MOUTH EVERY DAY      Cardiovascular:  Antilipid - Statins Failed - 03/22/2020  7:16 AM      Failed - LDL in normal range and within 360 days    LDL Chol Calc (NIH)  Date Value Ref Range Status  03/03/2020 101 (H) 0 - 99 mg/dL Final          Passed - Total Cholesterol in normal range and within 360 days    Cholesterol, Total  Date Value Ref Range Status  03/03/2020 183 100 - 199 mg/dL Final          Passed - HDL in normal range and within 360 days    HDL  Date Value Ref Range Status  03/03/2020 58 >39 mg/dL Final          Passed - Triglycerides in normal range and within 360 days    Triglycerides  Date Value Ref Range Status  03/03/2020 134 0 - 149 mg/dL Final          Passed - Patient is not pregnant      Passed - Valid encounter within last 12 months    Recent Outpatient Visits           2 weeks ago Essential hypertension   [facility] Duanne Limerick, MD   6 months ago Essential hypertension   [facility] Duanne Limerick, MD   7 months ago Trochanteric bursitis of left hip   [facility] Duanne Limerick, MD   1 year ago Essential hypertension   [facility] Duanne Limerick, MD   1 year ago Essential hypertension   [facility] Duanne Limerick, MD       Future Appointments             In 5 months Duanne Limerick, MD [facility]   In 5 months Duanne Limerick, MD [facility]

## 2020-06-12 ENCOUNTER — Ambulatory Visit: Payer: Medicare Other | Admitting: Family Medicine

## 2020-06-12 ENCOUNTER — Encounter: Payer: Self-pay | Admitting: Family Medicine

## 2020-06-12 ENCOUNTER — Other Ambulatory Visit: Payer: Self-pay

## 2020-06-12 VITALS — BP 120/70 | HR 64 | Ht 66.0 in | Wt 160.0 lb

## 2020-06-12 DIAGNOSIS — Z90711 Acquired absence of uterus with remaining cervical stump: Secondary | ICD-10-CM | POA: Diagnosis not present

## 2020-06-12 DIAGNOSIS — S60222A Contusion of left hand, initial encounter: Secondary | ICD-10-CM | POA: Diagnosis not present

## 2020-06-12 NOTE — Progress Notes (Signed)
Date:  06/12/2020   Name:  Ebony Harris   DOB:  [birth year]   MRN:  [id number]   Chief Complaint: Hand Pain (Was moving a pole for the gate and her hand was between a rod and chain link "thing")  Hand Pain  The incident occurred 2 days ago. The injury mechanism was a direct blow. Pain location: ulna aspect left hand. The quality of the pain is described as aching. The pain is moderate. The pain has been constant since the incident. Pertinent negatives include no chest pain, muscle weakness, numbness or tingling. She has tried acetaminophen, NSAIDs and ice for the symptoms. The treatment provided moderate relief.    Lab Results  Component Value Date   CREATININE 0.81 03/03/2020   BUN 21 03/03/2020   NA 142 03/03/2020   K 4.3 03/03/2020   CL 99 03/03/2020   CO2 27 03/03/2020   Lab Results  Component Value Date   CHOL 183 03/03/2020   HDL 58 03/03/2020   LDLCALC 101 (H) 03/03/2020   TRIG 134 03/03/2020   CHOLHDL 4.6 (H) 09/30/2017   No results found for: TSH No results found for: HGBA1C No results found for: WBC, HGB, HCT, MCV, PLT Lab Results  Component Value Date   ALT 44 (H) 03/03/2020   AST 27 03/03/2020   ALKPHOS 104 03/03/2020   BILITOT 0.6 03/03/2020     Review of Systems  Constitutional: Negative.  Negative for chills, fatigue, fever and unexpected weight change.  HENT: Negative for congestion, ear discharge, ear pain, rhinorrhea, sinus pressure, sneezing and sore throat.   Eyes: Negative for double vision, photophobia, pain, discharge, redness and itching.  Respiratory: Negative for cough, shortness of breath, wheezing and stridor.   Cardiovascular: Negative for chest pain.  Gastrointestinal: Negative for abdominal pain, blood in stool, constipation, diarrhea, nausea and vomiting.  Endocrine: Negative for cold intolerance, heat intolerance, polydipsia, polyphagia and polyuria.  Genitourinary: Negative for dysuria, flank pain, frequency, hematuria, menstrual  problem, pelvic pain, urgency, vaginal bleeding and vaginal discharge.  Musculoskeletal: Negative for arthralgias, back pain and myalgias.  Skin: Negative for rash.  Allergic/Immunologic: Negative for environmental allergies and food allergies.  Neurological: Negative for dizziness, tingling, weakness, light-headedness, numbness and headaches.  Hematological: Negative for adenopathy. Does not bruise/bleed easily.  Psychiatric/Behavioral: Negative for dysphoric mood. The patient is not nervous/anxious.     Patient Active Problem List   Diagnosis Date Noted   History of partial hysterectomy 06/12/2020   Mixed hyperlipidemia 09/25/2018   Cyst of breast 08/30/2018   Lichen sclerosus 01/11/2018   Special screening for malignant neoplasms, colon    Rectal polyp    Allergic rhinitis 09/26/2015   Herpes simplex 09/24/2015   Hypertension 11/26/2014    No Known Allergies  Past Surgical History:  Procedure Laterality Date   ABDOMINAL HYSTERECTOMY     partial   BREAST BIOPSY Left    03/2014 negative   BREAST BIOPSY Left    1992 negative   BREAST CYST ASPIRATION Left    negative   COLONOSCOPY WITH PROPOFOL N/A 02/09/2016   Procedure: COLONOSCOPY WITH PROPOFOL;  Surgeon: Midge Minium, MD;  Location: [facility];  Service: Endoscopy;  Laterality: N/A;   POLYPECTOMY  02/09/2016   Procedure: POLYPECTOMY;  Surgeon: Midge Minium, MD;  Location: [facility];  Service: Endoscopy;;    Social History   Tobacco Use   Smoking status: Former Smoker    Quit date: 04/19/1980    Years since quitting: 40.1  Smokeless tobacco: Never Used  Vaping Use   Vaping Use: Never used  Substance Use Topics   Alcohol use: Yes    Alcohol/week: 14.0 standard drinks    Types: 14 Glasses of wine per week   Drug use: No     Medication list has been reviewed and updated.  Current Meds  Medication Sig   aspirin EC 81 MG tablet Take 81 mg by mouth daily.   atorvastatin  (LIPITOR) 10 MG tablet TAKE 1 TABLET BY MOUTH EVERY DAY   Calcium Carbonate-Vit D-Min (CALTRATE 600+D PLUS MINERALS PO) Take 1 tablet by mouth daily.   Clobetasol Propionate 0.05 % lotion Apply to affected areas daily for one week when having symptoms.   lisinopril-hydrochlorothiazide (ZESTORETIC) 20-12.5 MG tablet Take 1 tablet by mouth daily.   Multiple Vitamins-Minerals (MULTIVITAMIN WITH MINERALS) tablet Take 1 tablet by mouth daily.    PHQ 2/9 Scores 03/03/2020 08/27/2019 07/27/2019 09/25/2018  PHQ - 2 Score 0 0 0 0  PHQ- 9 Score 0 0 0 0    GAD 7 : Generalized Anxiety Score 03/03/2020 08/27/2019 07/27/2019  Nervous, Anxious, on Edge 0 0 0  Control/stop worrying 0 0 0  Worry too much - different things 0 0 0  Trouble relaxing 0 0 0  Restless 0 0 0  Easily annoyed or irritable 0 0 0  Afraid - awful might happen 0 0 0  Total GAD 7 Score 0 0 0    BP Readings from Last 3 Encounters:  06/12/20 120/70  03/03/20 120/68  01/21/20 (!) 142/80    Physical Exam Vitals and nursing note reviewed.  Constitutional:      Appearance: She is well-developed and well-nourished.  HENT:     Head: Normocephalic.     Right Ear: External ear normal.     Left Ear: External ear normal.     Mouth/Throat:     Mouth: Oropharynx is clear and moist.  Eyes:     General: Lids are everted, no foreign bodies appreciated. No scleral icterus.       Left eye: No foreign body or hordeolum.     Extraocular Movements: EOM normal.     Conjunctiva/sclera: Conjunctivae normal.     Right eye: Right conjunctiva is not injected.     Left eye: Left conjunctiva is not injected.     Pupils: Pupils are equal, round, and reactive to light.  Neck:     Thyroid: No thyromegaly.     Vascular: No JVD.     Trachea: No tracheal deviation.  Cardiovascular:     Rate and Rhythm: Normal rate and regular rhythm.     Pulses: Intact distal pulses.     Heart sounds: Normal heart sounds. No murmur heard. No friction rub. No  gallop.   Pulmonary:     Effort: Pulmonary effort is normal. No respiratory distress.     Breath sounds: Normal breath sounds. No wheezing or rales.  Abdominal:     General: Bowel sounds are normal.     Palpations: Abdomen is soft. There is no hepatosplenomegaly or mass.     Tenderness: There is no abdominal tenderness. There is no guarding or rebound.  Musculoskeletal:        General: No tenderness or edema. Normal range of motion.     Cervical back: Normal range of motion and neck supple.  Lymphadenopathy:     Cervical: No cervical adenopathy.  Skin:    General: Skin is warm.     Findings:  No rash.  Neurological:     Mental Status: She is alert and oriented to person, place, and time.     Cranial Nerves: No cranial nerve deficit.     Deep Tendon Reflexes: Strength normal. Reflexes normal.  Psychiatric:        Mood and Affect: Mood and affect normal. Mood is not anxious or depressed.     Wt Readings from Last 3 Encounters:  06/12/20 160 lb (72.6 kg)  03/03/20 161 lb (73 kg)  01/21/20 158 lb (71.7 kg)    BP 120/70    Pulse 64    Ht 5\' 6"  (1.676 m)    Wt 160 lb (72.6 kg)    BMI 25.82 kg/m   Assessment and Plan: 1. Contusion of left hand, initial encounter New onset of a few days ago when there was blunt force trauma to the ulnar aspect of her left hand.  This caused an immediate hematoma which is since decreased in size but there is residual ecchymosis.  This is consistent with a contusion in the resolution thereof.  Patient's been instructed to continue to use cold therapy with some mild compression and elevation.  Patient's been instructed to use Advil or Aleve on a as needed basis for the inflammation and if continued pain we need to see for further evaluation.  There was no tenderness on the palmar aspect of the fourth and fifth metatarsals so we will hold on doing x-ray at this time but will do so if pain does not abate.  2. History of partial hysterectomy Patient is  documented with a partial hysterectomy with unknown whether cervix is present or not.

## 2020-06-12 NOTE — Patient Instructions (Signed)
Contusion A contusion is a deep bruise. Contusions are the result of a blunt injury to tissues and muscle fibers under the skin. The injury causes bleeding under the skin. The skin overlying the contusion may turn blue, purple, or yellow. Minor injuries will give you a painless contusion, but more severe injuries cause contusions that may stay painful and swollen for a few weeks. Follow these instructions at home: Pay attention to any changes in your symptoms. Let your health care provider know about them. Take these actions to relieve your pain. Managing pain, stiffness, and swelling  Use resting, icing, applying pressure (compression), and raising (elevating) the injured area. This is often called the RICE strategy. ? Rest the injured area. Return to your normal activities as told by your health care provider. Ask your health care provider what activities are safe for you. ? If directed, put ice on the injured area:  Put ice in a plastic bag.  Place a towel between your skin and the bag.  Leave the ice on for 20 minutes, 2-3 times per day. ? If directed, apply light compression to the injured area using an elastic bandage. Make sure the bandage is not wrapped too tightly. Remove and reapply the bandage as directed by your health care provider. ? If possible, raise (elevate) the injured area above the level of your heart while you are sitting or lying down.   General instructions  Take over-the-counter and prescription medicines only as told by your health care provider.  Keep all follow-up visits as told by your health care provider. This is important. Contact a health care provider if:  Your symptoms do not improve after several days of treatment.  Your symptoms get worse.  You have difficulty moving the injured area. Get help right away if:  You have severe pain.  You have numbness in a hand or foot.  Your hand or foot turns pale or cold. Summary  A contusion is a deep  bruise.  Contusions are the result of a blunt injury to tissues and muscle fibers under the skin.  It is treated with rest, ice, compression, and elevation. You may be given over-the-counter medicines for pain.  Contact a health care provider if your symptoms do not improve, or get worse.  Get help right away if you have severe pain, have numbness, or the area turns pale or cold. This information is not intended to replace advice given to you by your health care provider. Make sure you discuss any questions you have with your health care provider. Document Revised: 11/24/2017 Document Reviewed: 11/24/2017 Elsevier Patient Education  2021 [employer]  

## 2020-08-19 ENCOUNTER — Ambulatory Visit: Payer: Medicare Other | Admitting: Family Medicine

## 2020-09-01 ENCOUNTER — Other Ambulatory Visit: Payer: Self-pay

## 2020-09-01 ENCOUNTER — Ambulatory Visit: Payer: Medicare Other | Admitting: Family Medicine

## 2020-09-01 ENCOUNTER — Encounter: Payer: Self-pay | Admitting: Family Medicine

## 2020-09-01 VITALS — BP 130/80 | HR 68 | Ht 66.0 in | Wt 155.0 lb

## 2020-09-01 DIAGNOSIS — E782 Mixed hyperlipidemia: Secondary | ICD-10-CM | POA: Diagnosis not present

## 2020-09-01 DIAGNOSIS — I10 Essential (primary) hypertension: Secondary | ICD-10-CM

## 2020-09-01 MED ORDER — ATORVASTATIN CALCIUM 10 MG PO TABS: 1.0000 | ORAL_TABLET | Freq: Every day | ORAL | 1 refills | Status: DC

## 2020-09-01 MED ORDER — LISINOPRIL-HYDROCHLOROTHIAZIDE 20-12.5 MG PO TABS: 1.0000 | ORAL_TABLET | Freq: Every day | ORAL | 1 refills | Status: DC

## 2020-09-01 NOTE — Progress Notes (Signed)
Date:  09/01/2020   Name:  Ebony Harris   DOB:  May 24, 1955   MRN:  [id number]   Chief Complaint: Hypertension and Hyperlipidemia  Hypertension This is a chronic problem. The current episode started more than 1 year ago. The problem has been gradually improving since onset. The problem is controlled. Pertinent negatives include no anxiety, blurred vision, chest pain, headaches, malaise/fatigue, neck pain, orthopnea, palpitations, peripheral edema, PND, shortness of breath or sweats. There are no associated agents to hypertension. Risk factors for coronary artery disease include dyslipidemia. Past treatments include ACE inhibitors. The current treatment provides moderate improvement. There are no compliance problems.  There is no history of angina, kidney disease, CAD/MI, CVA, heart failure, left ventricular hypertrophy, PVD or retinopathy. There is no history of chronic renal disease, a hypertension causing med or renovascular disease.  Hyperlipidemia This is a chronic problem. The current episode started more than 1 year ago. The problem is controlled. Recent lipid tests were reviewed and are normal. She has no history of chronic renal disease. Pertinent negatives include no chest pain, focal sensory loss, focal weakness, leg pain, myalgias or shortness of breath. The current treatment provides moderate improvement of lipids. There are no compliance problems.  Risk factors for coronary artery disease include hypertension and dyslipidemia.    Lab Results  Component Value Date   CREATININE 0.81 03/03/2020   BUN 21 03/03/2020   NA 142 03/03/2020   K 4.3 03/03/2020   CL 99 03/03/2020   CO2 27 03/03/2020   Lab Results  Component Value Date   CHOL 183 03/03/2020   HDL 58 03/03/2020   LDLCALC 101 (H) 03/03/2020   TRIG 134 03/03/2020   CHOLHDL 4.6 (H) 09/30/2017   No results found for: TSH No results found for: HGBA1C No results found for: WBC, HGB, HCT, MCV, PLT Lab Results  Component  Value Date   ALT 44 (H) 03/03/2020   AST 27 03/03/2020   ALKPHOS 104 03/03/2020   BILITOT 0.6 03/03/2020     Review of Systems  Constitutional: Negative.  Negative for chills, fatigue, fever, malaise/fatigue and unexpected weight change.  HENT: Negative for congestion, ear discharge, ear pain, rhinorrhea, sinus pressure, sneezing and sore throat.   Eyes: Negative for blurred vision, photophobia, pain, discharge, redness and itching.  Respiratory: Negative for cough, shortness of breath, wheezing and stridor.   Cardiovascular: Negative for chest pain, palpitations, orthopnea and PND.  Gastrointestinal: Negative for abdominal pain, blood in stool, constipation, diarrhea, nausea and vomiting.  Endocrine: Negative for cold intolerance, heat intolerance, polydipsia, polyphagia and polyuria.  Genitourinary: Negative for dysuria, flank pain, frequency, hematuria, menstrual problem, pelvic pain, urgency, vaginal bleeding and vaginal discharge.  Musculoskeletal: Negative for arthralgias, back pain, myalgias and neck pain.  Skin: Negative for rash.  Allergic/Immunologic: Negative for environmental allergies and food allergies.  Neurological: Negative for dizziness, focal weakness, weakness, light-headedness, numbness and headaches.  Hematological: Negative for adenopathy. Does not bruise/bleed easily.  Psychiatric/Behavioral: Negative for dysphoric mood. The patient is not nervous/anxious.     Patient Active Problem List   Diagnosis Date Noted  . History of partial hysterectomy 06/12/2020  . Mixed hyperlipidemia 09/25/2018  . Cyst of breast 08/30/2018  . Lichen sclerosus 01/11/2018  . Special screening for malignant neoplasms, colon   . Rectal polyp   . Allergic rhinitis 09/26/2015  . Herpes simplex 09/24/2015  . Hypertension 11/26/2014    No Known Allergies  Past Surgical History:  Procedure Laterality Date  . ABDOMINAL  HYSTERECTOMY     partial  . BREAST BIOPSY Left    03/2014  negative  . BREAST BIOPSY Left    1992 negative  . BREAST CYST ASPIRATION Left    negative  . COLONOSCOPY WITH PROPOFOL N/A 02/09/2016   Procedure: COLONOSCOPY WITH PROPOFOL;  Surgeon: Midge Minium, MD;  Location[facility];  Service: Endoscopy;  Laterality: N/A;  . POLYPECTOMY  02/09/2016   Procedure: POLYPECTOMY;  Surgeon: Midge Minium, MD;  Location: [facility];  Service: Endoscopy;;    Social History   Tobacco Use  . Smoking status: Former Smoker    Quit date: 04/19/1980    Years since quitting: 40.3  . Smokeless tobacco: Never Used  Vaping Use  . Vaping Use: Never used  Substance Use Topics  . Alcohol use: Yes    Alcohol/week: 14.0 standard drinks    Types: 14 Glasses of wine per week  . Drug use: No     Medication list has been reviewed and updated.  Current Meds  Medication Sig  . atorvastatin (LIPITOR) 10 MG tablet TAKE 1 TABLET BY MOUTH EVERY DAY  . Calcium Carbonate-Vit D-Min (CALTRATE 600+D PLUS MINERALS PO) Take 1 tablet by mouth daily.  Marland Kitchen lisinopril-hydrochlorothiazide (ZESTORETIC) 20-12.5 MG tablet Take 1 tablet by mouth daily.  . Multiple Vitamins-Minerals (MULTIVITAMIN WITH MINERALS) tablet Take 1 tablet by mouth daily.  . [DISCONTINUED] aspirin EC 81 MG tablet Take 81 mg by mouth daily.    PHQ 2/9 Scores 09/01/2020 03/03/2020 08/27/2019 07/27/2019  PHQ - 2 Score 0 0 0 0  PHQ- 9 Score 0 0 0 0    GAD 7 : Generalized Anxiety Score 09/01/2020 03/03/2020 08/27/2019 07/27/2019  Nervous, Anxious, on Edge 0 0 0 0  Control/stop worrying 0 0 0 0  Worry too much - different things 0 0 0 0  Trouble relaxing 0 0 0 0  Restless 0 0 0 0  Easily annoyed or irritable 0 0 0 0  Afraid - awful might happen 0 0 0 0  Total GAD 7 Score 0 0 0 0    BP Readings from Last 3 Encounters:  09/01/20 130/80  06/12/20 120/70  03/03/20 120/68    Physical Exam Vitals and nursing note reviewed.  Constitutional:      Appearance: She is well-developed.  HENT:      Head: Normocephalic.     Right Ear: Tympanic membrane, ear canal and external ear normal.     Left Ear: Tympanic membrane, ear canal and external ear normal.     Nose: Nose normal.     Mouth/Throat:     Mouth: Mucous membranes are moist.  Eyes:     General: Lids are everted, no foreign bodies appreciated. No scleral icterus.       Left eye: No foreign body or hordeolum.     Conjunctiva/sclera: Conjunctivae normal.     Right eye: Right conjunctiva is not injected.     Left eye: Left conjunctiva is not injected.     Pupils: Pupils are equal, round, and reactive to light.  Neck:     Thyroid: No thyromegaly.     Vascular: No JVD.     Trachea: No tracheal deviation.  Cardiovascular:     Rate and Rhythm: Normal rate and regular rhythm.     Heart sounds: Normal heart sounds. No murmur heard. No friction rub. No gallop.   Pulmonary:     Effort: Pulmonary effort is normal. No respiratory distress.  Breath sounds: Normal breath sounds. No wheezing, rhonchi or rales.  Abdominal:     General: Bowel sounds are normal.     Palpations: Abdomen is soft. There is no mass.     Tenderness: There is no abdominal tenderness. There is no guarding or rebound.  Musculoskeletal:        General: No tenderness. Normal range of motion.     Cervical back: Normal range of motion and neck supple.  Lymphadenopathy:     Cervical: No cervical adenopathy.  Skin:    General: Skin is warm.     Findings: No rash.  Neurological:     Mental Status: She is alert and oriented to person, place, and time.     Cranial Nerves: No cranial nerve deficit.     Deep Tendon Reflexes: Reflexes normal.  Psychiatric:        Mood and Affect: Mood is not anxious or depressed.     Wt Readings from Last 3 Encounters:  09/01/20 155 lb (70.3 kg)  06/12/20 160 lb (72.6 kg)  03/03/20 161 lb (73 kg)    BP 130/80   Pulse 68   Ht 5\' 6"  (1.676 m)   Wt 155 lb (70.3 kg)   BMI 25.02 kg/m   Assessment and Plan:  1.  Essential hypertension Chronic.  Controlled.  Stable.  Blood pressure 130/80.  Continue lisinopril hydrochlorothiazide 20-12.5 mg once a day. - lisinopril-hydrochlorothiazide (ZESTORETIC) 20-12.5 MG tablet; Take 1 tablet by mouth daily.  Dispense: 90 tablet; Refill: 1  2. Mixed hyperlipidemia Chronic.  Controlled.  Stable.  Continue atorvastatin 10 mg once daily.  Reviewed previous labs and December were acceptable and we will recheck patient in November. - atorvastatin (LIPITOR) 10 MG tablet; Take 1 tablet (10 mg total) by mouth daily.  Dispense: 90 tablet; Refill: 1

## 2020-12-23 ENCOUNTER — Telehealth: Payer: Self-pay | Admitting: Family Medicine

## 2020-12-23 NOTE — Telephone Encounter (Signed)
Referral Request - Has patient seen PCP for this complaint? yes *If NO, is insurance requiring patient see PCP for this issue before PCP can refer them? Referral for which specialty: mammogram Preferred provider/office: N/A Reason for referral: breast exam

## 2021-02-18 ENCOUNTER — Ambulatory Visit: Payer: Medicare Other | Admitting: Family Medicine

## 2021-02-23 ENCOUNTER — Ambulatory Visit: Payer: Medicare Other | Admitting: Family Medicine

## 2021-02-23 ENCOUNTER — Other Ambulatory Visit: Payer: Self-pay

## 2021-02-23 ENCOUNTER — Encounter: Payer: Self-pay | Admitting: Family Medicine

## 2021-02-23 VITALS — BP 130/80 | HR 60 | Ht 66.0 in | Wt 156.0 lb

## 2021-02-23 DIAGNOSIS — Z1231 Encounter for screening mammogram for malignant neoplasm of breast: Secondary | ICD-10-CM | POA: Diagnosis not present

## 2021-02-23 DIAGNOSIS — N6002 Solitary cyst of left breast: Secondary | ICD-10-CM | POA: Diagnosis not present

## 2021-02-23 DIAGNOSIS — E782 Mixed hyperlipidemia: Secondary | ICD-10-CM | POA: Diagnosis not present

## 2021-02-23 DIAGNOSIS — L821 Other seborrheic keratosis: Secondary | ICD-10-CM

## 2021-02-23 DIAGNOSIS — I10 Essential (primary) hypertension: Secondary | ICD-10-CM

## 2021-02-23 MED ORDER — LISINOPRIL-HYDROCHLOROTHIAZIDE 20-12.5 MG PO TABS: 1.0000 | ORAL_TABLET | Freq: Every day | ORAL | 1 refills | Status: DC

## 2021-02-23 MED ORDER — ATORVASTATIN CALCIUM 10 MG PO TABS: 10.0000 mg | ORAL_TABLET | Freq: Every day | ORAL | 1 refills | Status: DC

## 2021-02-23 NOTE — Progress Notes (Signed)
Date:  02/23/2021   Name:  Ebony Harris   DOB:  [birth year]   MRN:  [id number]   Chief Complaint: Hypertension and Hyperlipidemia  Hypertension This is a chronic problem. The current episode started more than 1 year ago. The problem has been gradually improving since onset. The problem is controlled. Pertinent negatives include no blurred vision, chest pain, headaches, orthopnea, palpitations, peripheral edema, PND or shortness of breath. There are no associated agents to hypertension. Past treatments include ACE inhibitors and diuretics. The current treatment provides moderate improvement. There are no compliance problems.  There is no history of angina, kidney disease, CAD/MI, CVA, heart failure, left ventricular hypertrophy, PVD or retinopathy. There is no history of chronic renal disease, a hypertension causing med or renovascular disease.  Hyperlipidemia This is a chronic problem. The current episode started more than 1 year ago. The problem is controlled. Recent lipid tests were reviewed and are normal. She has no history of chronic renal disease or diabetes. Pertinent negatives include no chest pain, myalgias or shortness of breath. Current antihyperlipidemic treatment includes statins.   Lab Results  Component Value Date   CREATININE 0.81 03/03/2020   BUN 21 03/03/2020   NA 142 03/03/2020   K 4.3 03/03/2020   CL 99 03/03/2020   CO2 27 03/03/2020   Lab Results  Component Value Date   CHOL 183 03/03/2020   HDL 58 03/03/2020   LDLCALC 101 (H) 03/03/2020   TRIG 134 03/03/2020   CHOLHDL 4.6 (H) 09/30/2017   No results found for: TSH No results found for: HGBA1C No results found for: WBC, HGB, HCT, MCV, PLT Lab Results  Component Value Date   ALT 44 (H) 03/03/2020   AST 27 03/03/2020   ALKPHOS 104 03/03/2020   BILITOT 0.6 03/03/2020     Review of Systems  Eyes:  Negative for blurred vision.  Respiratory: Negative.  Negative for shortness of breath.   Cardiovascular:  Negative.  Negative for chest pain, palpitations, orthopnea and PND.  Gastrointestinal: Negative.   Genitourinary: Negative.   Musculoskeletal:  Negative for myalgias.  Neurological: Negative.  Negative for headaches.   Patient Active Problem List   Diagnosis Date Noted   History of partial hysterectomy 06/12/2020   Mixed hyperlipidemia 09/25/2018   Cyst of breast 08/30/2018   Lichen sclerosus 01/11/2018   Special screening for malignant neoplasms, colon    Rectal polyp    Allergic rhinitis 09/26/2015   Herpes simplex 09/24/2015   Hypertension 11/26/2014    No Known Allergies  Past Surgical History:  Procedure Laterality Date   ABDOMINAL HYSTERECTOMY     partial   BREAST BIOPSY Left    03/2014 negative   BREAST BIOPSY Left    1992 negative   BREAST CYST ASPIRATION Left    negative   COLONOSCOPY WITH PROPOFOL N/A 02/09/2016   Procedure: COLONOSCOPY WITH PROPOFOL;  Surgeon: Midge Minium, MD;  Location: [facility];  Service: Endoscopy;  Laterality: N/A;   POLYPECTOMY  02/09/2016   Procedure: POLYPECTOMY;  Surgeon: Midge Minium, MD;  Location: [facility];  Service: Endoscopy;;    Social History   Tobacco Use   Smoking status: Former    Types: Cigarettes    Quit date: 04/19/1980    Years since quitting: 40.8   Smokeless tobacco: Never  Vaping Use   Vaping Use: Never used  Substance Use Topics   Alcohol use: Yes    Alcohol/week: 14.0 standard drinks    Types: 14 Glasses  of wine per week   Drug use: No     Medication list has been reviewed and updated.  Current Meds  Medication Sig   atorvastatin (LIPITOR) 10 MG tablet Take 1 tablet (10 mg total) by mouth daily.   Clobetasol Propionate 0.05 % lotion Apply to affected areas daily for one week when having symptoms.   lisinopril-hydrochlorothiazide (ZESTORETIC) 20-12.5 MG tablet Take 1 tablet by mouth daily.   Multiple Vitamins-Minerals (MULTIVITAMIN WITH MINERALS) tablet Take 1 tablet by mouth daily.    [DISCONTINUED] Calcium Carbonate-Vit D-Min (CALTRATE 600+D PLUS MINERALS PO) Take 1 tablet by mouth daily.    PHQ 2/9 Scores 02/23/2021 09/01/2020 03/03/2020 08/27/2019  PHQ - 2 Score 0 0 0 0  PHQ- 9 Score 0 0 0 0    GAD 7 : Generalized Anxiety Score 02/23/2021 09/01/2020 03/03/2020 08/27/2019  Nervous, Anxious, on Edge 0 0 0 0  Control/stop worrying 0 0 0 0  Worry too much - different things 0 0 0 0  Trouble relaxing 0 0 0 0  Restless 0 0 0 0  Easily annoyed or irritable 0 0 0 0  Afraid - awful might happen 0 0 0 0  Total GAD 7 Score 0 0 0 0    BP Readings from Last 3 Encounters:  02/23/21 130/80  09/01/20 130/80  06/12/20 120/70    Physical Exam Vitals and nursing note reviewed.  HENT:     Right Ear: Tympanic membrane and ear canal normal.     Left Ear: Tympanic membrane and ear canal normal.     Nose: Nose normal. No congestion or rhinorrhea.  Cardiovascular:     Rate and Rhythm: Normal rate.     Pulses: Normal pulses.     Heart sounds: S1 normal and S2 normal. No murmur heard. No systolic murmur is present.  No diastolic murmur is present.    No gallop. No S3 or S4 sounds.  Pulmonary:     Effort: Pulmonary effort is normal.     Breath sounds: Normal breath sounds. No wheezing or rhonchi.  Chest:  Breasts:    Right: Normal. No swelling, bleeding, inverted nipple, mass, nipple discharge, skin change or tenderness.     Left: No swelling, bleeding, inverted nipple, mass, nipple discharge, skin change or tenderness.     Comments: Palpable area firmness left upper outer c/w cyst Abdominal:     Palpations: Abdomen is soft.  Musculoskeletal:        General: Normal range of motion.     Cervical back: Normal range of motion.     Right lower leg: No edema.     Left lower leg: No edema.  Skin:    Comments: Seb keratoseis abdomen  Neurological:     General: No focal deficit present.    Wt Readings from Last 3 Encounters:  02/23/21 156 lb (70.8 kg)  09/01/20 155 lb  (70.3 kg)  06/12/20 160 lb (72.6 kg)    BP 130/80   Pulse 60   Ht 5\' 6"  (1.676 m)   Wt 156 lb (70.8 kg)   BMI 25.18 kg/m   Assessment and Plan:  1. Mixed hyperlipidemia Chronic.  Controlled.  Stable.  Uncomplicated.  Patient is controlled with diet and atorvastatin 10 mg once a day.  Will check lipid panel for current status. - atorvastatin (LIPITOR) 10 MG tablet; Take 1 tablet (10 mg total) by mouth daily.  Dispense: 90 tablet; Refill: 1 - Lipid Panel With LDL/HDL Ratio  2. Essential  hypertension Chronic.  Controlled.  Stable.  Blood pressure is 130/80.  Continue lisinopril hydrochlorothiazide 20-12.5 mg once a day.  Will check CMP - lisinopril-hydrochlorothiazide (ZESTORETIC) 20-12.5 MG tablet; Take 1 tablet by mouth daily.  Dispense: 90 tablet; Refill: 1 - Comprehensive Metabolic Panel (CMET)  3. Encounter for screening mammogram for malignant neoplasm of breast Patient presents for ordering of mammogram.  Patient's had a previous cyst that was aspirated which left some firmness in the left upper outer breast.  We will schedule her for screening mammogram.  Bimanual breast exam was unremarkable for any abnormalities.  4. Cyst of left breast As noted above patient had history of aspiration of a cyst which less firmness in the left breast but patient was told that she "graduated "to screening mammograms since I suppose its been stable for so Blankenbeckler.  5. Seborrheic keratosis Recheck of a seborrheic keratoses on abdomen is unremarkable with no changes.

## 2021-02-24 LAB — COMPREHENSIVE METABOLIC PANEL
ALT: 32 IU/L (ref 0–32)
AST: 26 IU/L (ref 0–40)
Albumin/Globulin Ratio: 1.8 (ref 1.2–2.2)
Albumin: 4.6 g/dL (ref 3.8–4.8)
Alkaline Phosphatase: 94 IU/L (ref 44–121)
BUN/Creatinine Ratio: 22 (ref 12–28)
BUN: 18 mg/dL (ref 8–27)
Bilirubin Total: 0.5 mg/dL (ref 0.0–1.2)
CO2: 24 mmol/L (ref 20–29)
Calcium: 9.3 mg/dL (ref 8.7–10.3)
Chloride: 102 mmol/L (ref 96–106)
Creatinine, Ser: 0.81 mg/dL (ref 0.57–1.00)
Globulin, Total: 2.6 g/dL (ref 1.5–4.5)
Glucose: 106 mg/dL — ABNORMAL HIGH (ref 70–99)
Potassium: 4.5 mmol/L (ref 3.5–5.2)
Sodium: 141 mmol/L (ref 134–144)
Total Protein: 7.2 g/dL (ref 6.0–8.5)
eGFR: 80 mL/min/{1.73_m2} (ref >59)

## 2021-02-24 LAB — LIPID PANEL WITH LDL/HDL RATIO
Cholesterol, Total: 191 mg/dL (ref 100–199)
HDL: 63 mg/dL (ref >39)
LDL Chol Calc (NIH): 111 mg/dL — ABNORMAL HIGH (ref 0–99)
LDL/HDL Ratio: 1.8 ratio (ref 0.0–3.2)
Triglycerides: 96 mg/dL (ref 0–149)
VLDL Cholesterol Cal: 17 mg/dL (ref 5–40)

## 2021-02-25 ENCOUNTER — Other Ambulatory Visit: Payer: Self-pay | Admitting: Family Medicine

## 2021-02-25 DIAGNOSIS — E782 Mixed hyperlipidemia: Secondary | ICD-10-CM

## 2021-02-25 NOTE — Telephone Encounter (Signed)
First transmission did not go through so resending it.  Requested Prescriptions  Pending Prescriptions Disp Refills  . atorvastatin (LIPITOR) 10 MG tablet [Pharmacy Med Name: ATORVASTATIN 10 MG TABLET] 90 tablet 1    Sig: TAKE 1 TABLET BY MOUTH EVERY DAY     Cardiovascular:  Antilipid - Statins Failed - 02/25/2021  7:36 AM      Failed - LDL in normal range and within 360 days    LDL Chol Calc (NIH)  Date Value Ref Range Status  02/23/2021 111 (H) 0 - 99 mg/dL Final         Passed - Total Cholesterol in normal range and within 360 days    Cholesterol, Total  Date Value Ref Range Status  02/23/2021 191 100 - 199 mg/dL Final         Passed - HDL in normal range and within 360 days    HDL  Date Value Ref Range Status  02/23/2021 63 >39 mg/dL Final         Passed - Triglycerides in normal range and within 360 days    Triglycerides  Date Value Ref Range Status  02/23/2021 96 0 - 149 mg/dL Final         Passed - Patient is not pregnant      Passed - Valid encounter within last 12 months    Recent Outpatient Visits          2 days ago Mixed hyperlipidemia   [facility] Duanne Limerick, MD   5 months ago Essential hypertension   [facility] Duanne Limerick, MD   8 months ago Contusion of left hand, initial encounter   [facility] Duanne Limerick, MD   11 months ago Essential hypertension   [facility] Duanne Limerick, MD   1 year ago Essential hypertension   [facility] Duanne Limerick, MD      Future Appointments            In 6 months Duanne Limerick, MD [facility], [facility]

## 2021-03-02 ENCOUNTER — Ambulatory Visit (INDEPENDENT_AMBULATORY_CARE_PROVIDER_SITE_OTHER): Payer: Medicare Other | Admitting: Obstetrics & Gynecology

## 2021-03-02 ENCOUNTER — Encounter: Payer: Self-pay | Admitting: Obstetrics & Gynecology

## 2021-03-02 ENCOUNTER — Other Ambulatory Visit: Payer: Self-pay

## 2021-03-02 ENCOUNTER — Other Ambulatory Visit (HOSPITAL_COMMUNITY)
Admission: RE | Admit: 2021-03-02 | Discharge: 2021-03-02 | Disposition: A | Discharge status: Home / Self Care | Payer: Medicare Other | Source: Ambulatory Visit | Attending: Obstetrics & Gynecology | Admitting: Obstetrics & Gynecology

## 2021-03-02 VITALS — BP 120/80 | Ht 66.0 in | Wt 159.0 lb

## 2021-03-02 DIAGNOSIS — L900 Lichen sclerosus et atrophicus: Secondary | ICD-10-CM

## 2021-03-02 DIAGNOSIS — Z13820 Encounter for screening for osteoporosis: Secondary | ICD-10-CM | POA: Diagnosis not present

## 2021-03-02 DIAGNOSIS — Z01419 Encounter for gynecological examination (general) (routine) without abnormal findings: Secondary | ICD-10-CM | POA: Diagnosis not present

## 2021-03-02 DIAGNOSIS — Z1272 Encounter for screening for malignant neoplasm of vagina: Secondary | ICD-10-CM

## 2021-03-02 DIAGNOSIS — Z90711 Acquired absence of uterus with remaining cervical stump: Secondary | ICD-10-CM

## 2021-03-02 MED ORDER — CLOBETASOL PROPIONATE 0.05 % EX LOTN: TOPICAL_LOTION | CUTANEOUS | 3 refills | Status: AC

## 2021-03-02 MED ORDER — CLOBETASOL PROPIONATE 0.05 % EX LOTN: TOPICAL_LOTION | CUTANEOUS | 3 refills | Status: DC

## 2021-03-02 NOTE — Patient Instructions (Signed)
PAP every 5 years Mammogram every year DEXA bone scan also,  at [facility] Colonoscopy every 10 years Labs yearly (with PCP)  Thank you for choosing [facility]. As part of our ongoing efforts to improve patient experience, we would appreciate your feedback. Please fill out the short survey that you will receive by mail or MyChart. Your opinion is important to [place]! - Dr. Tiburcio Pea  Recommendations to boost your immunity to prevent illness such as viral flu and colds, including covid19, are as follows:       - - -  Vitamin K2 and Vitamin D3  - - - Take Vitamin K2 at 200-300 mcg daily (usually 2-3 pills daily of the over the counter formulation). Take Vitamin D3 at 3000-4000 U daily (usually 3-4 pills daily of the over the counter formulation). Studies show that these two at high normal levels in your system are very effective in keeping your immunity so strong and protective that you will be unlikely to contract viral illness such as those listed above.  Dr Tiburcio Pea Bone Density Test A bone density test uses a type of X-ray to measure the amount of calcium and other minerals in a person's bones. It can measure bone density in the hip and the spine. The test is similar to having a regular X-ray. This test may also be called: Bone densitometry. Bone mineral density test. Dual-energy X-ray absorptiometry (DEXA). You may have this test to: Diagnose a condition that causes weak or thin bones (osteoporosis). Screen you for osteoporosis. Predict your risk for a broken bone (fracture). Determine how well your osteoporosis treatment is working. Tell a health care provider about: Any allergies you have. All medicines you are taking, including vitamins, herbs, eye drops, creams, and over-the-counter medicines. Any problems you or family members have had with anesthetic medicines. Any blood disorders you have. Any surgeries you have had. Any medical conditions you have. Whether you are pregnant or may  be pregnant. Any medical tests you have had within the past 14 days that used contrast material. What are the risks? Generally, this is a safe test. However, it does expose you to a small amount of radiation, which can slightly increase your cancer risk. What happens before the test? Do not take any calcium supplements within the 24 hours before your test. You will need to remove all metal jewelry, eyeglasses, removable dental appliances, and any other metal objects on your body. What happens during the test?  You will lie down on an exam table. There will be an X-ray generator below you and an imaging device above you. Other devices, such as boxes or braces, may be used to position your body properly for the scan. The machine will slowly scan your body. You will need to keep very still while the machine does the scan. The images will show up on a screen in the room. Images will be examined by a specialist after your test is finished. The procedure may vary among health care providers and hospitals. What can I expect after the test? It is up to you to get the results of your test. Ask your health care provider, or the department that is doing the test, when your results will be ready. Summary A bone density test is an imaging test that uses a type of X-ray to measure the amount of calcium and other minerals in your bones. The test may be used to diagnose or screen you for a condition that causes weak or thin bones (  osteoporosis), predict your risk for a broken bone (fracture), or determine how well your osteoporosis treatment is working. Do not take any calcium supplements within 24 hours before your test. Ask your health care provider, or the department that is doing the test, when your results will be ready. This information is not intended to replace advice given to you by your health care provider. Make sure you discuss any questions you have with your health care provider. Document  Revised: 12/17/2020 Document Reviewed: 09/20/2019 Elsevier Patient Education  2022 [employer].

## 2021-03-02 NOTE — Addendum Note (Signed)
Addended by: Nadara Mustard on: 03/02/2021 11:27 AM   Modules accepted: Orders

## 2021-03-02 NOTE — Progress Notes (Signed)
HPI:      [name] is a 65 y.o. [phone] who LMP was in the past (prior hysterectomy), she presents today for her annual examination.  The patient has no complaints today. She has Lichen Sclerosus, occasional flare (Clobetasol) The patient is sexually active. Herlast pap: approximate date 2017 and was normal and last mammogram: approximate date 2021 and was normal.  The patient does perform self breast exams.  There is notable family history of breast or ovarian cancer in her family. The patient is not taking hormone replacement therapy. Patient denies post-menopausal vaginal bleeding.   The patient has regular exercise: yes. The patient denies current symptoms of depression.    GYN Hx: Last Colonoscopy: 6 yrs  ago. Normal.  Last DEXA:  not yet done .    PMHx: Past Medical History:  Diagnosis Date   Family history of breast cancer    daughter was BRCA neg; pt qualifies for My Risk testing; letter sent 5/18   Fibrocystic breast changes    Heart murmur    Hypertension    Lichen sclerosus    Wears contact lenses    Past Surgical History:  Procedure Laterality Date   ABDOMINAL HYSTERECTOMY     partial   BREAST BIOPSY Left    03/2014 negative   BREAST BIOPSY Left    1992 negative   BREAST CYST ASPIRATION Left    negative   COLONOSCOPY WITH PROPOFOL N/A 02/09/2016   Procedure: COLONOSCOPY WITH PROPOFOL;  Surgeon: Lucilla Lame, MD;  Location: [place];  Service: Endoscopy;  Laterality: N/A;   POLYPECTOMY  02/09/2016   Procedure: POLYPECTOMY;  Surgeon: Lucilla Lame, MD;  Location: [place];  Service: Endoscopy;;   Family History  Problem Relation Age of Onset   Breast cancer Daughter 52       died at 80 with mets; BRCA neg   Heart disease Father    Hypertension Father    Breast cancer Paternal Aunt 59   Breast cancer Cousin 37   Melanoma Mother    Breast cancer Cousin 31   Social History   Tobacco Use   Smoking status: Former    Types: Cigarettes     Quit date: 04/19/1980    Years since quitting: 40.8   Smokeless tobacco: Never  Vaping Use   Vaping Use: Never used  Substance Use Topics   Alcohol use: Yes    Alcohol/week: 14.0 standard drinks    Types: 14 Glasses of wine per week   Drug use: No    Current Outpatient Medications:    atorvastatin (LIPITOR) 10 MG tablet, TAKE 1 TABLET BY MOUTH EVERY DAY, Disp: 90 tablet, Rfl: 1   Clobetasol Propionate 0.05 % lotion, Apply to affected areas daily for one week when having symptoms., Disp: 59 mL, Rfl: 3   lisinopril-hydrochlorothiazide (ZESTORETIC) 20-12.5 MG tablet, Take 1 tablet by mouth daily., Disp: 90 tablet, Rfl: 1   Multiple Vitamins-Minerals (MULTIVITAMIN WITH MINERALS) tablet, Take 1 tablet by mouth daily., Disp: , Rfl:  Allergies: Patient has no known allergies.  Review of Systems  Constitutional:  Negative for chills, fever and malaise/fatigue.  HENT:  Negative for congestion, sinus pain and sore throat.   Eyes:  Negative for blurred vision and pain.  Respiratory:  Negative for cough and wheezing.   Cardiovascular:  Negative for chest pain and leg swelling.  Gastrointestinal:  Negative for abdominal pain, constipation, diarrhea, heartburn, nausea and vomiting.  Genitourinary:  Negative for dysuria, frequency, hematuria and  urgency.  Musculoskeletal:  Negative for back pain, joint pain, myalgias and neck pain.  Skin:  Negative for itching and rash.  Neurological:  Negative for dizziness, tremors and weakness.  Endo/Heme/Allergies:  Does not bruise/bleed easily.  Psychiatric/Behavioral:  Negative for depression. The patient is not nervous/anxious and does not have insomnia.    Objective: BP 120/80   Ht '5\' 6"'  (1.676 m)   Wt 159 lb (72.1 kg)   BMI 25.66 kg/m   Filed Weights   03/02/21 0950  Weight: 159 lb (72.1 kg)   Body mass index is 25.66 kg/m. Physical Exam Constitutional:      General: She is not in acute distress.    Appearance: She is well-developed.   Genitourinary:     Vulva, bladder, rectum and urethral meatus normal.     No lesions in the vagina.     Genitourinary Comments: Vaginal cuff well healed     Right Labia: No rash, tenderness or lesions.    Left Labia: No tenderness, lesions or rash.    No vaginal bleeding.      Right Adnexa: not tender and no mass present.    Left Adnexa: not tender and no mass present.    Cervix is absent.     Uterus is absent.     Pelvic exam was performed with patient in the lithotomy position.  Breasts:    Right: No mass, skin change or tenderness.     Left: No mass, skin change or tenderness.  HENT:     Head: Normocephalic and atraumatic. No laceration.     Right Ear: Hearing normal.     Left Ear: Hearing normal.     Mouth/Throat:     Pharynx: Uvula midline.  Eyes:     Pupils: Pupils are equal, round, and reactive to light.  Neck:     Thyroid: No thyromegaly.  Cardiovascular:     Rate and Rhythm: Normal rate and regular rhythm.     Heart sounds: No murmur heard.   No friction rub. No gallop.  Pulmonary:     Effort: Pulmonary effort is normal. No respiratory distress.     Breath sounds: Normal breath sounds. No wheezing.  Abdominal:     General: Bowel sounds are normal. There is no distension.     Palpations: Abdomen is soft.     Tenderness: There is no abdominal tenderness. There is no rebound.  Musculoskeletal:        General: Normal range of motion.     Cervical back: Normal range of motion and neck supple.  Neurological:     Mental Status: She is alert and oriented to person, place, and time.     Cranial Nerves: No cranial nerve deficit.  Skin:    General: Skin is warm and dry.  Psychiatric:        Judgment: Judgment normal.  Vitals reviewed.    Assessment: Annual Exam 1. Women's annual routine gynecological examination   2. Lichen sclerosus   3. History of partial hysterectomy     Plan:            1.  Vaginal Screening-  Pap smear done today  2. Breast screening-  Exam annually and mammogram scheduled (04/2021)  3. Colonoscopy every 10 years, Hemoccult testing after age 12  4. Labs managed by PCP  5. Counseling for hormonal therapy: none              6. FRAX - FRAX score for assessing the 10  year probability for fracture calculated and discussed today.  Based on age and score today, DEXA is recommended   7. Lichen Sclerosus, Cont PRN use Clobetasol    F/U  Return in about 1 year (around 03/02/2022) for Annual.  Barnett Applebaum, MD, [facility], [facility] 03/02/2021  10:03 AM

## 2021-03-05 LAB — CYTOLOGY - PAP: Diagnosis: NEGATIVE

## 2021-04-06 ENCOUNTER — Telehealth: Payer: Self-pay | Admitting: Family Medicine

## 2021-04-06 NOTE — Telephone Encounter (Signed)
Copied from CRM [id number]. Topic: Medicare AWV >> Apr 06, 2021  1:34 PM Claudette Laws R wrote: Reason for CRM:  Left message for patient to call back and schedule Medicare Annual Wellness Visit (AWV).   Please offer to do virtually or by telephone.  No hx of AWV eligible for AWVI as of 01/17/2021  per [facility]  Please schedule at anytime with [facility] Advisor.      40 Minutes appointment   Any questions, please call me at [phone]

## 2021-04-28 ENCOUNTER — Ambulatory Visit: Payer: Medicare Other

## 2021-05-04 ENCOUNTER — Ambulatory Visit
Admission: RE | Admit: 2021-05-04 | Discharge: 2021-05-04 | Disposition: A | Discharge status: Home / Self Care | Payer: Medicare Other | Source: Ambulatory Visit | Attending: Family Medicine | Admitting: Family Medicine

## 2021-05-04 ENCOUNTER — Telehealth: Payer: Self-pay

## 2021-05-04 ENCOUNTER — Other Ambulatory Visit: Payer: Self-pay | Admitting: Obstetrics & Gynecology

## 2021-05-04 ENCOUNTER — Ambulatory Visit
Admission: RE | Admit: 2021-05-04 | Discharge: 2021-05-04 | Disposition: A | Discharge status: Home / Self Care | Payer: Medicare Other | Source: Ambulatory Visit | Attending: Obstetrics & Gynecology | Admitting: Obstetrics & Gynecology

## 2021-05-04 ENCOUNTER — Other Ambulatory Visit: Payer: Self-pay

## 2021-05-04 DIAGNOSIS — Z1231 Encounter for screening mammogram for malignant neoplasm of breast: Secondary | ICD-10-CM | POA: Insufficient documentation

## 2021-05-04 DIAGNOSIS — Z780 Asymptomatic menopausal state: Secondary | ICD-10-CM | POA: Insufficient documentation

## 2021-05-04 DIAGNOSIS — Z13820 Encounter for screening for osteoporosis: Secondary | ICD-10-CM | POA: Insufficient documentation

## 2021-05-04 DIAGNOSIS — M810 Age-related osteoporosis without current pathological fracture: Secondary | ICD-10-CM

## 2021-05-04 NOTE — Progress Notes (Signed)
Referral

## 2021-05-04 NOTE — Telephone Encounter (Signed)
Copied from [place] [id number]. Topic: General - Inquiry >> May 04, 2021  2:34 PM Scherrie Gerlach wrote: Reason for CRM: pt would like a call back to advise which pna vaccine Baxter Flattery was telling her about and when she could get pna vaccine.  Pt wanted to speak with Baxter Flattery prior to scheduling.

## 2021-05-08 ENCOUNTER — Other Ambulatory Visit: Payer: Self-pay

## 2021-05-08 ENCOUNTER — Ambulatory Visit (INDEPENDENT_AMBULATORY_CARE_PROVIDER_SITE_OTHER): Payer: Medicare Other

## 2021-05-08 DIAGNOSIS — Z23 Encounter for immunization: Secondary | ICD-10-CM

## 2021-05-08 NOTE — Progress Notes (Signed)
Pneum vacc given

## 2021-06-29 ENCOUNTER — Ambulatory Visit: Payer: Medicare Other | Admitting: Family Medicine

## 2021-06-29 ENCOUNTER — Encounter: Payer: Self-pay | Admitting: Family Medicine

## 2021-06-29 ENCOUNTER — Other Ambulatory Visit: Payer: Self-pay

## 2021-06-29 VITALS — BP 130/80 | HR 80 | Ht 66.0 in | Wt 158.0 lb

## 2021-06-29 DIAGNOSIS — J0100 Acute maxillary sinusitis, unspecified: Secondary | ICD-10-CM | POA: Diagnosis not present

## 2021-06-29 DIAGNOSIS — J301 Allergic rhinitis due to pollen: Secondary | ICD-10-CM

## 2021-06-29 MED ORDER — AMOXICILLIN-POT CLAVULANATE 875-125 MG PO TABS: 1.0000 | ORAL_TABLET | Freq: Two times a day (BID) | ORAL | 0 refills | Status: DC

## 2021-06-29 MED ORDER — TRIAMCINOLONE ACETONIDE 55 MCG/ACT NA AERO: 2.0000 | INHALATION_SPRAY | Freq: Every day | NASAL | 12 refills | Status: DC

## 2021-06-29 NOTE — Progress Notes (Signed)
? ? ?Date:  06/29/2021  ? ?Name:  Ebony Harris   DOB:  1956/02/20   MRN:  [id number] ? ? ?Chief Complaint: Sinusitis (L) side face hurting and behind eye, teeth hurting. No cough, no color to drainage) ? ?Sinusitis ?This is a new problem. The current episode started in the past 7 days (onset Friday). The problem has been waxing and waning since onset. Maximum temperature: 99.5. Her pain is at a severity of 4/10. The pain is moderate. Associated symptoms include congestion, diaphoresis, ear pain, headaches, sinus pressure and sneezing. Pertinent negatives include no chills, coughing, shortness of breath, sore throat or swollen glands. Treatments tried: aleve. The treatment provided no relief.  ? ?Lab Results  ?Component Value Date  ? NA 141 02/23/2021  ? K 4.5 02/23/2021  ? CO2 24 02/23/2021  ? GLUCOSE 106 (H) 02/23/2021  ? BUN 18 02/23/2021  ? CREATININE 0.81 02/23/2021  ? CALCIUM 9.3 02/23/2021  ? EGFR 80 02/23/2021  ? GFRNONAA 76 03/03/2020  ? ?Lab Results  ?Component Value Date  ? CHOL 191 02/23/2021  ? HDL 63 02/23/2021  ? LDLCALC 111 (H) 02/23/2021  ? TRIG 96 02/23/2021  ? CHOLHDL 4.6 (H) 09/30/2017  ? ?No results found for: TSH ?No results found for: HGBA1C ?No results found for: WBC, HGB, HCT, MCV, PLT ?Lab Results  ?Component Value Date  ? ALT 32 02/23/2021  ? AST 26 02/23/2021  ? ALKPHOS 94 02/23/2021  ? BILITOT 0.5 02/23/2021  ? ?No results found for: 25OHVITD2, [place], VD25OH  ? ?Review of Systems  ?Constitutional:  Positive for diaphoresis. Negative for chills.  ?HENT:  Positive for congestion, ear pain, sinus pressure and sneezing. Negative for sore throat.   ?Respiratory:  Negative for cough, chest tightness and shortness of breath.   ?Cardiovascular:  Negative for chest pain.  ?Neurological:  Positive for headaches.  ?Hematological:  Negative for adenopathy.  ? ?Patient Active Problem List  ? Diagnosis Date Noted  ? History of partial hysterectomy 06/12/2020  ? Mixed hyperlipidemia 09/25/2018  ? Cyst  of breast 08/30/2018  ? Lichen sclerosus 45/36/4680  ? Special screening for malignant neoplasms, colon   ? Rectal polyp   ? Allergic rhinitis 09/26/2015  ? Herpes simplex 09/24/2015  ? Hypertension 11/26/2014  ? ? ?No Known Allergies ? ?Past Surgical History:  ?Procedure Laterality Date  ? ABDOMINAL HYSTERECTOMY    ? partial  ? BREAST BIOPSY Left   ? 03/2014 negative  ? BREAST BIOPSY Left   ? 1992 negative  ? BREAST CYST ASPIRATION Left   ? negative  ? COLONOSCOPY WITH PROPOFOL N/A 02/09/2016  ? Procedure: COLONOSCOPY WITH PROPOFOL;  Surgeon: Lucilla Lame, MD;  Location: [place];  Service: Endoscopy;  Laterality: N/A;  ? POLYPECTOMY  02/09/2016  ? Procedure: POLYPECTOMY;  Surgeon: Lucilla Lame, MD;  Location: [place];  Service: Endoscopy;;  ? ? ?Social History  ? ?Tobacco Use  ? Smoking status: Former  ?  Types: Cigarettes  ?  Quit date: 04/19/1980  ?  Years since quitting: 41.2  ? Smokeless tobacco: Never  ?Vaping Use  ? Vaping Use: Never used  ?Substance Use Topics  ? Alcohol use: Yes  ?  Alcohol/week: 14.0 standard drinks  ?  Types: 14 Glasses of wine per week  ? Drug use: No  ? ? ? ?Medication list has been reviewed and updated. ? ?Current Meds  ?Medication Sig  ? atorvastatin (LIPITOR) 10 MG tablet TAKE 1 TABLET BY MOUTH EVERY DAY  ?  Calcium Carb-Cholecalciferol (CALCIUM 1000 + D PO) Take 1 tablet by mouth daily.  ? Clobetasol Propionate 0.05 % lotion Apply to affected areas daily for one week when having symptoms.  ? lisinopril-hydrochlorothiazide (ZESTORETIC) 20-12.5 MG tablet Take 1 tablet by mouth daily.  ? Multiple Vitamins-Minerals (MULTIVITAMIN WITH MINERALS) tablet Take 1 tablet by mouth daily.  ? ? ?PHQ 2/9 Scores 02/23/2021 09/01/2020 03/03/2020 08/27/2019  ?PHQ - 2 Score 0 0 0 0  ?PHQ- 9 Score 0 0 0 0  ? ? ?GAD 7 : Generalized Anxiety Score 02/23/2021 09/01/2020 03/03/2020 08/27/2019  ?Nervous, Anxious, on Edge 0 0 0 0  ?Control/stop worrying 0 0 0 0  ?Worry too much - different things  0 0 0 0  ?Trouble relaxing 0 0 0 0  ?Restless 0 0 0 0  ?Easily annoyed or irritable 0 0 0 0  ?Afraid - awful might happen 0 0 0 0  ?Total GAD 7 Score 0 0 0 0  ? ? ?BP Readings from Last 3 Encounters:  ?06/29/21 130/80  ?03/02/21 120/80  ?02/23/21 130/80  ? ? ?Physical Exam ?Vitals and nursing note reviewed. Exam conducted with a chaperone present.  ?Constitutional:   ?   General: She is not in acute distress. ?   Appearance: She is not diaphoretic.  ?HENT:  ?   Head: Normocephalic and atraumatic.  ?   Right Ear: External ear normal. Tympanic membrane is retracted.  ?   Left Ear: External ear normal. Tympanic membrane is retracted.  ?   Nose: Congestion and rhinorrhea present.  ?   Right Turbinates: Swollen.  ?   Left Turbinates: Swollen.  ?   Right Sinus: No maxillary sinus tenderness or frontal sinus tenderness.  ?   Left Sinus: Maxillary sinus tenderness and frontal sinus tenderness present.  ?   Mouth/Throat:  ?   Mouth: Mucous membranes are moist.  ?   Dentition: Normal dentition.  ?   Pharynx: No oropharyngeal exudate or posterior oropharyngeal erythema.  ?Eyes:  ?   General:     ?   Right eye: No discharge.     ?   Left eye: No discharge.  ?   Conjunctiva/sclera: Conjunctivae normal.  ?   Pupils: Pupils are equal, round, and reactive to light.  ?Neck:  ?   Thyroid: No thyromegaly.  ?   Vascular: No JVD.  ?Cardiovascular:  ?   Rate and Rhythm: Normal rate and regular rhythm.  ?   Heart sounds: Normal heart sounds. No murmur heard. ?  No friction rub. No gallop.  ?Pulmonary:  ?   Effort: Pulmonary effort is normal.  ?   Breath sounds: Normal breath sounds. No wheezing or rhonchi.  ?Abdominal:  ?   General: Bowel sounds are normal.  ?   Palpations: Abdomen is soft. There is no mass.  ?   Tenderness: There is no abdominal tenderness. There is no guarding.  ?Musculoskeletal:     ?   General: Normal range of motion.  ?   Cervical back: Normal range of motion and neck supple.  ?Lymphadenopathy:  ?   Cervical: No  cervical adenopathy.  ?Skin: ?   General: Skin is warm and dry.  ?Neurological:  ?   Mental Status: She is alert.  ?   Deep Tendon Reflexes: Reflexes are normal and symmetric.  ? ? ?Wt Readings from Last 3 Encounters:  ?06/29/21 158 lb (71.7 kg)  ?03/02/21 159 lb (72.1 kg)  ?02/23/21 156 lb (70.8  kg)  ? ? ?BP 130/80   Pulse 80   Ht '5\' 6"'  (1.676 m)   Wt 158 lb (71.7 kg)   BMI 25.50 kg/m?  ? ?Assessment and Plan: ? ?1. Acute maxillary sinusitis, recurrence not specified ?New onset.  Persistent.  Stable.  History and exam is consistent with acute maxillary sinusitis.  We will treat with Augmentin 875 mg 1 twice a day. ?- amoxicillin-clavulanate (AUGMENTIN) 875-125 MG tablet; Take 1 tablet by mouth 2 (two) times daily.  Dispense: 20 tablet; Refill: 0 ? ?2. Seasonal allergic rhinitis due to pollen ?New intermittent.  Currently persistent.  Patient is presenting today the highest pollen count day of the year.  We are not going to initiate Nasacort nasal spray along with suggestion of low-dose pseudoephedrine. ?- triamcinolone (NASACORT) 55 MCG/ACT AERO nasal inhaler; Place 2 sprays into the nose daily.  Dispense: 1 each; Refill: 12  ? ? ?

## 2021-08-21 ENCOUNTER — Other Ambulatory Visit: Payer: Self-pay | Admitting: Family Medicine

## 2021-08-21 DIAGNOSIS — E782 Mixed hyperlipidemia: Secondary | ICD-10-CM

## 2021-08-21 NOTE — Telephone Encounter (Signed)
Requested Prescriptions  ?Pending Prescriptions Disp Refills  ?? atorvastatin (LIPITOR) 10 MG tablet [Pharmacy Med Name: ATORVASTATIN 10 MG TABLET] 90 tablet 0  ?  Sig: TAKE 1 TABLET BY MOUTH EVERY DAY  ?  ? Cardiovascular:  Antilipid - Statins Failed - 08/21/2021  7:23 AM  ?  ?  Failed - Lipid Panel in normal range within the last 12 months  ?  Cholesterol, Total  ?Date Value Ref Range Status  ?02/23/2021 191 100 - 199 mg/dL Final  ? ?LDL Chol Calc (NIH)  ?Date Value Ref Range Status  ?02/23/2021 111 (H) 0 - 99 mg/dL Final  ? ?HDL  ?Date Value Ref Range Status  ?02/23/2021 63 >39 mg/dL Final  ? ?Triglycerides  ?Date Value Ref Range Status  ?02/23/2021 96 0 - 149 mg/dL Final  ? ?  ?  ?  Passed - Patient is not pregnant  ?  ?  Passed - Valid encounter within last 12 months  ?  Recent Outpatient Visits   ?      ? 1 month ago Acute maxillary sinusitis, recurrence not specified  ? [facility] Duanne Limerick, MD  ? 5 months ago Mixed hyperlipidemia  ? [facility] Duanne Limerick, MD  ? 11 months ago Essential hypertension  ? [facility] Duanne Limerick, MD  ? 1 year ago Contusion of left hand, initial encounter  ? [facility] Duanne Limerick, MD  ? 1 year ago Essential hypertension  ? [facility] Duanne Limerick, MD  ?  ?  ?Future Appointments   ?        ? In 3 days Duanne Limerick, MD [facility]  ?  ? ?  ?  ?  ? ? ?

## 2021-08-24 ENCOUNTER — Ambulatory Visit: Payer: Medicare Other | Admitting: Family Medicine

## 2021-08-24 ENCOUNTER — Encounter: Payer: Self-pay | Admitting: Family Medicine

## 2021-08-24 VITALS — BP 138/84 | HR 72 | Ht 66.0 in | Wt 156.0 lb

## 2021-08-24 DIAGNOSIS — I10 Essential (primary) hypertension: Secondary | ICD-10-CM | POA: Diagnosis not present

## 2021-08-24 DIAGNOSIS — M65342 Trigger finger, left ring finger: Secondary | ICD-10-CM | POA: Diagnosis not present

## 2021-08-24 DIAGNOSIS — E782 Mixed hyperlipidemia: Secondary | ICD-10-CM

## 2021-08-24 MED ORDER — LISINOPRIL-HYDROCHLOROTHIAZIDE 20-12.5 MG PO TABS: 1.0000 | ORAL_TABLET | Freq: Every day | ORAL | 1 refills | Status: DC

## 2021-08-24 MED ORDER — ATORVASTATIN CALCIUM 10 MG PO TABS: 10.0000 mg | ORAL_TABLET | Freq: Every day | ORAL | 1 refills | Status: DC

## 2021-08-24 NOTE — Patient Instructions (Addendum)
Why follow it? Research shows. Those who follow the Mediterranean diet have a reduced risk of heart disease  The diet is associated with a reduced incidence of Parkinson's and Alzheimer's diseases People following the diet may have longer life expectancies and lower rates of chronic diseases  The Dietary Guidelines for Americans recommends the Mediterranean diet as an eating plan to promote health and prevent disease  What Is the Mediterranean Diet?  Healthy eating plan based on typical foods and recipes of Mediterranean-style cooking The diet is primarily a plant based diet; these foods should make up a majority of meals   Starches - Plant based foods should make up a majority of meals - They are an important sources of vitamins, minerals, energy, antioxidants, and fiber - Choose whole grains, foods high in fiber and minimally processed items  - Typical grain sources include wheat, oats, barley, corn, brown rice, bulgar, farro, millet, polenta, couscous  - Various types of beans include chickpeas, lentils, fava beans, black beans, white beans   Fruits  Veggies - Large quantities of antioxidant rich fruits & veggies; 6 or more servings  - Vegetables can be eaten raw or lightly drizzled with oil and cooked  - Vegetables common to the traditional Mediterranean Diet include: artichokes, arugula, beets, broccoli, brussel sprouts, cabbage, carrots, celery, collard greens, cucumbers, eggplant, kale, leeks, lemons, lettuce, mushrooms, okra, onions, peas, peppers, potatoes, pumpkin, radishes, rutabaga, shallots, spinach, sweet potatoes, turnips, zucchini - Fruits common to the Mediterranean Diet include: apples, apricots, avocados, cherries, clementines, dates, figs, grapefruits, grapes, melons, nectarines, oranges, peaches, pears, pomegranates, strawberries, tangerines  Fats - Replace butter and margarine with healthy oils, such as olive oil, canola oil, and tahini  - Limit nuts to no more than a  handful a day  - Nuts include walnuts, almonds, pecans, pistachios, pine nuts  - Limit or avoid candied, honey roasted or heavily salted nuts - Olives are central to the Mediterranean diet - can be eaten whole or used in a variety of dishes   Meats Protein - Limiting red meat: no more than a few times a month - When eating red meat: choose lean cuts and keep the portion to the size of deck of cards - Eggs: approx. 0 to 4 times a week  - Fish and lean poultry: at least 2 a week  - Healthy protein sources include, chicken, turkey, lean beef, lamb - Increase intake of seafood such as tuna, salmon, trout, mackerel, shrimp, scallops - Avoid or limit high fat processed meats such as sausage and bacon  Dairy - Include moderate amounts of low fat dairy products  - Focus on healthy dairy such as fat free yogurt, skim milk, low or reduced fat cheese - Limit dairy products higher in fat such as whole or 2% milk, cheese, ice cream  Alcohol - Moderate amounts of red wine is ok  - No more than 5 oz daily for women (all ages) and men older than age 65  - No more than 10 oz of wine daily for men younger than 65  Other - Limit sweets and other desserts  - Use herbs and spices instead of salt to flavor foods  - Herbs and spices common to the traditional Mediterranean Diet include: basil, bay leaves, chives, cloves, cumin, fennel, garlic, lavender, marjoram, mint, oregano, parsley, pepper, rosemary, sage, savory, sumac, tarragon, thyme   It's not just a diet, it's a lifestyle:  The Mediterranean diet includes lifestyle factors typical of those in the   region  ?Foods, drinks and meals are best eaten with others and savored ?Daily physical activity is important for overall good health ?This could be strenuous exercise like running and aerobics ?This could also be more leisurely activities such as walking, housework, yard-work, or taking the stairs ?Moderation is the key; a balanced and healthy diet accommodates most  foods and drinks ?Consider portion sizes and frequency of consumption of certain foods  ? ?Meal Ideas & Options:  ?Breakfast:  ?Whole wheat toast or whole wheat English muffins with peanut butter & hard boiled egg ?Steel cut oats topped with apples & cinnamon and skim milk  ?Fresh fruit: banana, strawberries, melon, berries, peaches  ?Smoothies: strawberries, bananas, greek yogurt, peanut butter ?Low fat greek yogurt with blueberries and granola  ?Egg white omelet with spinach and mushrooms ?Breakfast couscous: whole wheat couscous, apricots, skim milk, cranberries  ?Sandwiches:  ?Hummus and grilled vegetables (peppers, zucchini, squash) on whole wheat bread   ?Grilled chicken on whole wheat pita with lettuce, tomatoes, cucumbers or tzatziki  ?Tuna salad on whole wheat bread: tuna salad made with greek yogurt, olives, red peppers, capers, green onions ?Garlic rosemary lamb pita: lamb saut?ed with garlic, rosemary, salt & pepper; add lettuce, cucumber, greek yogurt to pita - flavor with lemon juice and black pepper  ?Seafood:  ?Mediterranean grilled salmon, seasoned with garlic, basil, parsley, lemon juice and black pepper ?Shrimp, lemon, and spinach whole-grain pasta salad made with low fat greek yogurt  ?Seared scallops with lemon orzo  ?Seared tuna steaks seasoned salt, pepper, coriander topped with tomato mixture of olives, tomatoes, olive oil, minced garlic, parsley, green onions and cappers  ?Meats:  ?Herbed greek chicken salad with kalamata olives, cucumber, feta  ?Red bell peppers stuffed with spinach, bulgur, lean ground beef (or lentils) & topped with feta   ?Kebabs: skewers of chicken, tomatoes, onions, zucchini, squash  ?[place] burgers: made with red onions, mint, dill, lemon juice, feta cheese topped with roasted red peppers ?Vegetarian ?Cucumber salad: cucumbers, artichoke hearts, celery, red onion, feta cheese, tossed in olive oil & lemon juice  ?Hummus and whole grain pita points with a greek salad  (lettuce, tomato, feta, olives, cucumbers, red onion) ?Lentil soup with celery, carrots made with vegetable broth, garlic, salt and pepper  ?Tabouli salad: parsley, bulgur, mint, scallions, cucumbers, tomato, radishes, lemon juice, olive oil, salt and pepper. ?    Trigger Finger ? ?Trigger finger, also called stenosing tenosynovitis,  is a condition that causes a finger to get stuck in a bent position. Each finger has a tendon, which is a tough, cord-like tissue that connects muscle to bone, and each tendon passes through a tunnel of tissue called a tendon sheath. ?To move your finger, your tendon needs to glide freely through the sheath. Trigger finger happens when the tendon or the sheath thickens, making it difficult to move your finger. Trigger finger can affect any finger or a thumb. It may affect more than one finger. Mild cases may clear up with rest and medicine. Severe cases require more treatment. ?What are the causes? ?Trigger finger is caused by a thickened finger tendon or tendon sheath. The cause of this thickening is not known. ?What increases the risk? ?The following factors may make you more likely to develop this condition: ?Doing activities that require a strong grip. ?Having rheumatoid arthritis, gout, or diabetes. ?Being 47-16 years old. ?Being female. ?What are the signs or symptoms? ?Symptoms of this condition include: ?Pain when bending or straightening your finger. ?Tenderness  or swelling where your finger attaches to the palm of your hand. ?A lump in the palm of your hand or on the inside of your finger. ?Hearing a noise like a pop or a snap when you try to straighten your finger. ?Feeling a catching or locking sensation when you try to straighten your finger. ?Being unable to straighten your finger. ?How is this diagnosed? ?This condition is diagnosed based on your symptoms and a physical exam. ?How is this treated? ?This condition may be treated by: ?Resting your finger and avoiding  activities that make symptoms worse. ?Wearing a finger splint to keep your finger extended. ?Taking NSAIDs, such as ibuprofen, to relieve pain and swelling. ?Doing gentle exercises to stretch the finger as tol

## 2021-08-24 NOTE — Progress Notes (Signed)
? ? ?Date:  08/24/2021  ? ?Name:  Ebony Harris   DOB:  18-Dec-1955   MRN:  [id number] ? ? ?Chief Complaint: Hypertension and Hyperlipidemia ? ?Hypertension ?This is a chronic problem. The current episode started more than 1 year ago. The problem has been gradually improving since onset. The problem is controlled. Pertinent negatives include no anxiety, blurred vision, chest pain, headaches, malaise/fatigue, neck pain, orthopnea, palpitations, peripheral edema, PND, shortness of breath or sweats. There are no associated agents to hypertension. Past treatments include ACE inhibitors and diuretics. The current treatment provides moderate improvement. There are no compliance problems.  There is no history of angina, kidney disease, CAD/MI, CVA, heart failure, left ventricular hypertrophy, PVD or retinopathy. There is no history of chronic renal disease, a hypertension causing med or renovascular disease.  ?Hyperlipidemia ?This is a chronic problem. The current episode started more than 1 year ago. The problem is controlled. Recent lipid tests were reviewed and are normal. She has no history of chronic renal disease. Pertinent negatives include no chest pain, focal sensory loss, focal weakness, leg pain, myalgias or shortness of breath. Current antihyperlipidemic treatment includes statins. The current treatment provides moderate improvement of lipids. There are no compliance problems.  Risk factors for coronary artery disease include hypertension and dyslipidemia.  ? ?Lab Results  ?Component Value Date  ? NA 141 02/23/2021  ? K 4.5 02/23/2021  ? CO2 24 02/23/2021  ? GLUCOSE 106 (H) 02/23/2021  ? BUN 18 02/23/2021  ? CREATININE 0.81 02/23/2021  ? CALCIUM 9.3 02/23/2021  ? EGFR 80 02/23/2021  ? GFRNONAA 76 03/03/2020  ? ?Lab Results  ?Component Value Date  ? CHOL 191 02/23/2021  ? HDL 63 02/23/2021  ? LDLCALC 111 (H) 02/23/2021  ? TRIG 96 02/23/2021  ? CHOLHDL 4.6 (H) 09/30/2017  ? ?No results found for: TSH ?No results found  for: HGBA1C ?No results found for: WBC, HGB, HCT, MCV, PLT ?Lab Results  ?Component Value Date  ? ALT 32 02/23/2021  ? AST 26 02/23/2021  ? ALKPHOS 94 02/23/2021  ? BILITOT 0.5 02/23/2021  ? ?No results found for: 25OHVITD2, [place], VD25OH  ? ?Review of Systems  ?Constitutional:  Negative for chills, fever and malaise/fatigue.  ?HENT:  Negative for drooling, ear discharge, ear pain and sore throat.   ?Eyes:  Negative for blurred vision.  ?Respiratory:  Negative for cough, shortness of breath and wheezing.   ?Cardiovascular:  Negative for chest pain, palpitations, orthopnea, leg swelling and PND.  ?Gastrointestinal:  Negative for abdominal pain, blood in stool, constipation, diarrhea and nausea.  ?Endocrine: Negative for polydipsia.  ?Genitourinary:  Negative for dysuria, frequency, hematuria and urgency.  ?Musculoskeletal:  Negative for back pain, myalgias and neck pain.  ?Skin:  Negative for rash.  ?Allergic/Immunologic: Negative for environmental allergies.  ?Neurological:  Negative for dizziness, focal weakness and headaches.  ?Hematological:  Does not bruise/bleed easily.  ?Psychiatric/Behavioral:  Negative for suicidal ideas. The patient is not nervous/anxious.   ? ?Patient Active Problem List  ? Diagnosis Date Noted  ? History of partial hysterectomy 06/12/2020  ? Mixed hyperlipidemia 09/25/2018  ? Cyst of breast 08/30/2018  ? Lichen sclerosus 81/15/7262  ? Special screening for malignant neoplasms, colon   ? Rectal polyp   ? Allergic rhinitis 09/26/2015  ? Herpes simplex 09/24/2015  ? Hypertension 11/26/2014  ? ? ?No Known Allergies ? ?Past Surgical History:  ?Procedure Laterality Date  ? ABDOMINAL HYSTERECTOMY    ? partial  ? BREAST BIOPSY Left   ?  03/2014 negative  ? BREAST BIOPSY Left   ? 1992 negative  ? BREAST CYST ASPIRATION Left   ? negative  ? COLONOSCOPY WITH PROPOFOL N/A 02/09/2016  ? Procedure: COLONOSCOPY WITH PROPOFOL;  Surgeon: Lucilla Lame, MD;  Location: [place];  Service:  Endoscopy;  Laterality: N/A;  ? POLYPECTOMY  02/09/2016  ? Procedure: POLYPECTOMY;  Surgeon: Lucilla Lame, MD;  Location: [place];  Service: Endoscopy;;  ? ? ?Social History  ? ?Tobacco Use  ? Smoking status: Former  ?  Types: Cigarettes  ?  Quit date: 04/19/1980  ?  Years since quitting: 41.3  ? Smokeless tobacco: Never  ?Vaping Use  ? Vaping Use: Never used  ?Substance Use Topics  ? Alcohol use: Yes  ?  Alcohol/week: 14.0 standard drinks  ?  Types: 14 Glasses of wine per week  ? Drug use: No  ? ? ? ?Medication list has been reviewed and updated. ? ?Current Meds  ?Medication Sig  ? atorvastatin (LIPITOR) 10 MG tablet TAKE 1 TABLET BY MOUTH EVERY DAY  ? Calcium Carb-Cholecalciferol (CALCIUM 1000 + D PO) Take 1 tablet by mouth daily.  ? Clobetasol Propionate 0.05 % lotion Apply to affected areas daily for one week when having symptoms.  ? lisinopril-hydrochlorothiazide (ZESTORETIC) 20-12.5 MG tablet Take 1 tablet by mouth daily.  ? Multiple Vitamins-Minerals (MULTIVITAMIN WITH MINERALS) tablet Take 1 tablet by mouth daily.  ? [DISCONTINUED] triamcinolone (NASACORT) 55 MCG/ACT AERO nasal inhaler Place 2 sprays into the nose daily.  ? ? ? ?  08/24/2021  ?  8:04 AM 02/23/2021  ?  7:54 AM 09/01/2020  ?  8:24 AM 03/03/2020  ?  8:26 AM  ?GAD 7 : Generalized Anxiety Score  ?Nervous, Anxious, on Edge 0 0 0 0  ?Control/stop worrying 0 0 0 0  ?Worry too much - different things 0 0 0 0  ?Trouble relaxing 0 0 0 0  ?Restless 0 0 0 0  ?Easily annoyed or irritable 0 0 0 0  ?Afraid - awful might happen 0 0 0 0  ?Total GAD 7 Score 0 0 0 0  ?Anxiety Difficulty Not difficult at all     ? ? ? ?  08/24/2021  ?  8:03 AM  ?Depression screen PHQ 2/9  ?Decreased Interest 0  ?Down, Depressed, Hopeless 0  ?PHQ - 2 Score 0  ?Altered sleeping 1  ?Tired, decreased energy 0  ?Change in appetite 0  ?Feeling bad or failure about yourself  0  ?Trouble concentrating 0  ?Moving slowly or fidgety/restless 0  ?Suicidal thoughts 0  ?PHQ-9 Score 1   ?Difficult doing work/chores Not difficult at all  ? ? ?BP Readings from Last 3 Encounters:  ?08/24/21 138/84  ?06/29/21 130/80  ?03/02/21 120/80  ? ? ?Physical Exam ?Vitals and nursing note reviewed. Exam conducted with a chaperone present.  ?Constitutional:   ?   General: She is not in acute distress. ?   Appearance: She is not diaphoretic.  ?HENT:  ?   Head: Normocephalic and atraumatic.  ?   Right Ear: Tympanic membrane and external ear normal.  ?   Left Ear: Tympanic membrane and external ear normal.  ?   Nose: Nose normal. No congestion or rhinorrhea.  ?Eyes:  ?   General:     ?   Right eye: No discharge.     ?   Left eye: No discharge.  ?   Conjunctiva/sclera: Conjunctivae normal.  ?   Pupils: Pupils are  equal, round, and reactive to light.  ?Neck:  ?   Thyroid: No thyromegaly.  ?   Vascular: No JVD.  ?Cardiovascular:  ?   Rate and Rhythm: Normal rate and regular rhythm.  ?   Heart sounds: Normal heart sounds. No murmur heard. ?  No friction rub. No gallop.  ?Pulmonary:  ?   Effort: Pulmonary effort is normal.  ?   Breath sounds: Normal breath sounds. No wheezing or rhonchi.  ?Abdominal:  ?   General: Bowel sounds are normal.  ?   Palpations: Abdomen is soft. There is no mass.  ?   Tenderness: There is no abdominal tenderness. There is no guarding.  ?Musculoskeletal:     ?   General: Normal range of motion.  ?   Cervical back: Normal range of motion and neck supple.  ?Lymphadenopathy:  ?   Cervical: No cervical adenopathy.  ?Skin: ?   General: Skin is warm and dry.  ?Neurological:  ?   Mental Status: She is alert.  ?   Deep Tendon Reflexes: Reflexes are normal and symmetric.  ? ? ?Wt Readings from Last 3 Encounters:  ?08/24/21 156 lb (70.8 kg)  ?06/29/21 158 lb (71.7 kg)  ?03/02/21 159 lb (72.1 kg)  ? ? ?BP 138/84   Pulse 72   Ht '5\' 6"'  (1.676 m)   Wt 156 lb (70.8 kg)   BMI 25.18 kg/m?  ? ?Assessment and Plan: ? ?1. Essential hypertension ?Chronic.  Controlled.  Stable.  Blood pressure 138/84.  Continue  lisinopril hydrochlorothiazide 20-12.5 mg once a day.  Will check renal function panel for electrolytes and GFR.  We will recheck patient in 6 months. ?- lisinopril-hydrochlorothiazide (ZESTORETIC) 20-12.5 MG tablet;

## 2021-08-25 LAB — RENAL FUNCTION PANEL
Albumin: 4.2 g/dL (ref 3.8–4.8)
BUN/Creatinine Ratio: 24 (ref 12–28)
BUN: 20 mg/dL (ref 8–27)
CO2: 24 mmol/L (ref 20–29)
Calcium: 8.9 mg/dL (ref 8.7–10.3)
Chloride: 100 mmol/L (ref 96–106)
Creatinine, Ser: 0.85 mg/dL (ref 0.57–1.00)
Glucose: 105 mg/dL — ABNORMAL HIGH (ref 70–99)
Phosphorus: 3.7 mg/dL (ref 3.0–4.3)
Potassium: 4.5 mmol/L (ref 3.5–5.2)
Sodium: 137 mmol/L (ref 134–144)
eGFR: 76 mL/min/{1.73_m2} (ref >59)

## 2021-08-25 LAB — LIPID PANEL WITH LDL/HDL RATIO
Cholesterol, Total: 177 mg/dL (ref 100–199)
HDL: 59 mg/dL (ref >39)
LDL Chol Calc (NIH): 100 mg/dL — ABNORMAL HIGH (ref 0–99)
LDL/HDL Ratio: 1.7 ratio (ref 0.0–3.2)
Triglycerides: 99 mg/dL (ref 0–149)
VLDL Cholesterol Cal: 18 mg/dL (ref 5–40)

## 2021-09-30 ENCOUNTER — Ambulatory Visit: Payer: Medicare Other | Admitting: Family Medicine

## 2021-09-30 ENCOUNTER — Encounter: Payer: Self-pay | Admitting: Family Medicine

## 2021-09-30 VITALS — BP 130/80 | HR 78 | Ht 66.0 in | Wt 152.0 lb

## 2021-09-30 DIAGNOSIS — N61 Mastitis without abscess: Secondary | ICD-10-CM

## 2021-09-30 DIAGNOSIS — R253 Fasciculation: Secondary | ICD-10-CM | POA: Diagnosis not present

## 2021-09-30 MED ORDER — AMOXICILLIN-POT CLAVULANATE 875-125 MG PO TABS: 1.0000 | ORAL_TABLET | Freq: Two times a day (BID) | ORAL | 0 refills | Status: DC

## 2021-09-30 NOTE — Progress Notes (Signed)
Date:  09/30/2021   Name:  Ebony Harris   DOB:  Jan 10, 1956   MRN:  [id number]   Chief Complaint: breast tenderness (R) breast tenderness and feels a "vibration" in the left breast during the day)  HPI  Lab Results  Component Value Date   NA 137 08/24/2021   K 4.5 08/24/2021   CO2 24 08/24/2021   GLUCOSE 105 (H) 08/24/2021   BUN 20 08/24/2021   CREATININE 0.85 08/24/2021   CALCIUM 8.9 08/24/2021   EGFR 76 08/24/2021   GFRNONAA 76 03/03/2020   Lab Results  Component Value Date   CHOL 177 08/24/2021   HDL 59 08/24/2021   LDLCALC 100 (H) 08/24/2021   TRIG 99 08/24/2021   CHOLHDL 4.6 (H) 09/30/2017   No results found for: "TSH" No results found for: "HGBA1C" No results found for: "WBC", "HGB", "HCT", "MCV", "PLT" Lab Results  Component Value Date   ALT 32 02/23/2021   AST 26 02/23/2021   ALKPHOS 94 02/23/2021   BILITOT 0.5 02/23/2021   No results found for: "25OHVITD2", "25OHVITD3", "VD25OH"   Review of Systems  HENT:  Negative for congestion.   Respiratory:  Negative for shortness of breath and wheezing.   Cardiovascular:  Positive for chest pain. Negative for palpitations and leg swelling.    Patient Active Problem List   Diagnosis Date Noted   History of partial hysterectomy 06/12/2020   Mixed hyperlipidemia 09/25/2018   Cyst of breast 03/70/4888   Lichen sclerosus 91/69/4503   Special screening for malignant neoplasms, colon    Rectal polyp    Allergic rhinitis 09/26/2015   Herpes simplex 09/24/2015   Hypertension 11/26/2014    No Known Allergies  Past Surgical History:  Procedure Laterality Date   ABDOMINAL HYSTERECTOMY     partial   BREAST BIOPSY Left    03/2014 negative   BREAST BIOPSY Left    1992 negative   BREAST CYST ASPIRATION Left    negative   COLONOSCOPY WITH PROPOFOL N/A 02/09/2016   Procedure: COLONOSCOPY WITH PROPOFOL;  Surgeon: Lucilla Lame, MD;  Location: [place];  Service: Endoscopy;  Laterality: N/A;   POLYPECTOMY   02/09/2016   Procedure: POLYPECTOMY;  Surgeon: Lucilla Lame, MD;  Location: [place];  Service: Endoscopy;;    Social History   Tobacco Use   Smoking status: Former    Types: Cigarettes    Quit date: 04/19/1980    Years since quitting: 41.4   Smokeless tobacco: Never  Vaping Use   Vaping Use: Never used  Substance Use Topics   Alcohol use: Yes    Alcohol/week: 14.0 standard drinks of alcohol    Types: 14 Glasses of wine per week   Drug use: No     Medication list has been reviewed and updated.  Current Meds  Medication Sig   atorvastatin (LIPITOR) 10 MG tablet Take 1 tablet (10 mg total) by mouth daily.   Calcium Carb-Cholecalciferol (CALCIUM 1000 + D PO) Take 1 tablet by mouth daily.   Clobetasol Propionate 0.05 % lotion Apply to affected areas daily for one week when having symptoms.   lisinopril-hydrochlorothiazide (ZESTORETIC) 20-12.5 MG tablet Take 1 tablet by mouth daily.   Multiple Vitamins-Minerals (MULTIVITAMIN WITH MINERALS) tablet Take 1 tablet by mouth daily.       09/30/2021   11:24 AM 08/24/2021    8:04 AM 02/23/2021    7:54 AM 09/01/2020    8:24 AM  GAD 7 : Generalized Anxiety Score  Nervous, Anxious, on Edge 0 0 0 0  Control/stop worrying 0 0 0 0  Worry too much - different things 0 0 0 0  Trouble relaxing 0 0 0 0  Restless 0 0 0 0  Easily annoyed or irritable 0 0 0 0  Afraid - awful might happen 0 0 0 0  Total GAD 7 Score 0 0 0 0  Anxiety Difficulty Not difficult at all Not difficult at all         09/30/2021   11:23 AM  Depression screen PHQ 2/9  Decreased Interest 0  Down, Depressed, Hopeless 0  PHQ - 2 Score 0  Altered sleeping 0  Tired, decreased energy 0  Change in appetite 0  Feeling bad or failure about yourself  0  Trouble concentrating 0  Moving slowly or fidgety/restless 0  Suicidal thoughts 0  PHQ-9 Score 0  Difficult doing work/chores Not difficult at all    BP Readings from Last 3 Encounters:  09/30/21 130/80   08/24/21 138/84  06/29/21 130/80    Physical Exam HENT:     Nose: No congestion or rhinorrhea.  Cardiovascular:     Rate and Rhythm: Normal rate and regular rhythm.     Pulses: Normal pulses.     Heart sounds: S1 normal and S2 normal. No murmur heard.    No systolic murmur is present.     No diastolic murmur is present.     No friction rub. No gallop. No S3 or S4 sounds.  Pulmonary:     Breath sounds: No wheezing or rhonchi.  Neurological:     Mental Status: She is alert.     Wt Readings from Last 3 Encounters:  09/30/21 152 lb (68.9 kg)  08/24/21 156 lb (70.8 kg)  06/29/21 158 lb (71.7 kg)    BP 130/80   Pulse 78   Ht _0  (1.676 m)   Wt 152 lb (68.9 kg)   BMI 24.53 kg/m   Assessment and Plan:  1. Mastitis New onset.  Tenderness and erythema noted in the medial lower quadrant of the right breast.  This is consistent with mastitis and will treat with Augmentin 875 mg twice a day for 7 days with warm compresses if needed as well. - amoxicillin-clavulanate (AUGMENTIN) 875-125 MG tablet; Take 1 tablet by mouth 2 (two) times daily.  Dispense: 20 tablet; Refill: 0  2. Fasciculation Likely fasciculations patient does not relate any chest pain or palpitation and only lasts for a matter of seconds.  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  We will keep an eye on this and if it continues her neck step is to look in a cardiac direction.

## 2021-09-30 NOTE — Patient Instructions (Signed)
Mastitis  Mastitis is inflammation of the breast tissue. It occurs most often in women who are breastfeeding, but it can also affect other women, and sometimes even men. Mastitis will sometimes go away on its own. A health care provider will help determine if medical treatment is needed. What are the causes? This condition is usually caused by a bacterial infection. Bacteria can enter the breast tissue through cuts, cracks, or openings in the skin. This usually occurs with breastfeeding because of cracked or irritated nipples. Sometimes, mastitis can occur when there are no cuts or openings in the skin. This is usually caused by plugged milk ducts. Plugged milk ducts block the flow of milk in the breast. Other causes include: Nipple piercing. Some forms of breast cancer. What are the signs or symptoms? Symptoms of this condition include: Swelling, redness, tenderness, and pain in an area of the breast. The area may also feel warm to the touch. These symptoms usually affect the upper part of the breast, toward the armpit region. Swelling of the glands under the arm on the same side. Discharge from the nipple. Fatigue, headache, and flu-like muscle aches. Fever and chills. Nausea and vomiting. Rapid pulse. Symptoms usually last 2-5 days. Breast pain and redness are at their worst on days 2 and 3, and they usually go away by day 5. If an infection is left to worsen, a collection of pus, or an abscess, may develop. How is this diagnosed? This condition can usually be diagnosed based on a physical exam and your symptoms. You may also have other tests, such as: Blood tests to check if your body is fighting an infection from bacteria. Mammogram or ultrasound tests to rule out other problems or diseases. Fluid tests. If an abscess has developed, the fluid in the abscess may be removed with a needle. The fluid may be checked to see whether bacteria are present. Breast milk testing. A sample of breast  milk may be tested for bacteria. This is done only when breastfeeding or pumping. How is this treated? Mastitis that occurs with breastfeeding will sometimes go away on its own, so your health care provider may choose to wait 24 hours after first seeing you to decide whether a prescription medicine is needed. If treatment is needed, it may include: Continuing to breastfeed or pump from both breasts to allow adequate milk flow and prevent an abscess from forming. Applying hot or cold compresses to the affected area. Medicine for pain. Antibiotic medicine to treat a bacterial infection. Self-care, such as rest and drinking more fluids. Using a needle to remove fluid from an abscess if one has developed. Follow these instructions at home: Breast care  Keep your nipples clean and dry. If directed, apply heat to the affected area of your breast. Use the heat source that your health care provider or lactation specialist recommends. If directed, place ice on the affected area of your breast. To do this: Put ice in a plastic bag. Place a towel between your skin and the bag. Leave the ice on for 20 minutes, 2-3 times a day. Remove the ice if your skin turns bright red. This is very important. If you cannot feel pain, heat, or cold, you have a greater risk of damage to the area. Breastfeeding and pumping tips Continue to breastfeed your baby on demand. This means feeding your baby whenever he or she is hungry. Ask your health care provider or lactation specialist whether you need to change your breastfeeding or pumping   routine. Avoid using nipple shields for feedings, if possible. Ask a lactation specialist for assistance if needed. Alternate the breast you offer first at each feeding to make sure your baby feeds from both breasts equally. Offer both breasts to your baby at every feeding. Use gentle breast massage during feeding or pumping sessions only as told by your health care provider or  lactation specialist. Avoid allowing your breasts to become very full with milk (engorged). If your breasts are engorged, you can hand express a small amount of milk for comfort. If you are pumping, continue to pump on the same schedule as you were before. In the breast with mastitis, pump until very little milk is coming out. Do not empty your breast. Emptying your breast causes your body to make more milk and can make symptoms worse. Ask your health care provider or lactation specialist whether you need to change your pumping routine. Medicines Take over-the-counter and prescription medicines as told by your health care provider. If you were prescribed an antibiotic medicine, take it as told by your health care provider. Do not stop using the antibiotic even if you start to feel better. Contact a health care provider if: You have pus-like discharge from the breast. You have a fever. Your symptoms do not improve within 2 days of starting treatment. Get help right away if: Your pain and swelling are getting worse. You have pain that is not controlled with medicine. You have a red line extending from the breast toward your armpit. Summary Mastitis is inflammation of the breast tissue. It occurs most often in women who are breastfeeding, but it can also affect non-breastfeeding women and some men. This condition is usually caused by a bacterial infection. This condition may be treated with hot and cold compresses, medicines, self-care, and breastfeeding. If you were prescribed an antibiotic medicine, take it as told by your health care provider. Do not stop using the antibiotic even if you start to feel better. This information is not intended to replace advice given to you by your health care provider. Make sure you discuss any questions you have with your health care provider. Document Revised: 05/08/2021 Document Reviewed: 02/04/2020 Elsevier Patient Education  2023 [employer] Inc.  

## 2021-10-21 ENCOUNTER — Telehealth: Payer: Self-pay | Admitting: Family Medicine

## 2021-10-21 NOTE — Telephone Encounter (Signed)
Copied from CRM [id number]. Topic: Medicare AWV >> Oct 21, 2021  9:05 AM Zannie Kehr wrote: Reason for CRM:  Left message for patient to call back and schedule Medicare Annual Wellness Visit (AWV) in office.   If unable to come into the office for AWV,  please offer to do virtually or by telephone.  No hx of AWV eligible for AWVI per [employer] as of 01/17/2021   Please schedule at anytime with [facility] Health Advisor.      45 minute appointment   Any questions, please call me at [phone]

## 2021-11-17 ENCOUNTER — Telehealth: Payer: Self-pay | Admitting: Family Medicine

## 2021-11-17 DIAGNOSIS — E782 Mixed hyperlipidemia: Secondary | ICD-10-CM

## 2021-11-17 NOTE — Telephone Encounter (Signed)
Pt called in returning [place] call. Pt received a message about a medication refill request from pharmacy. [id number] - [place], [place]   Pt stated she does not need any medication refills at this time.

## 2021-12-08 ENCOUNTER — Telehealth: Payer: Self-pay | Admitting: Family Medicine

## 2021-12-08 NOTE — Telephone Encounter (Signed)
Copied from CRM [id number]. Topic: Medicare AWV >> Dec 08, 2021 11:58 AM Zannie Kehr wrote: Reason for CRM:  Left message for patient to call back and schedule Medicare Annual Wellness Visit (AWV) in office.   If unable to come into the office for AWV,  please offer to do virtually or by telephone.  No hx of AWV eligible for AWVI per [employer] as of 01/17/2021   Please schedule at anytime with [facility] Health Advisor.      45 minute appointment   Any questions, please call me at [phone]

## 2021-12-09 ENCOUNTER — Telehealth: Payer: Self-pay | Admitting: Family Medicine

## 2021-12-09 NOTE — Telephone Encounter (Signed)
Patient called in wanting to speak with Delice Bison, about referral she had from her gyno Dr, Dr Velora Mediate, to a Endocronologist. She doesn't feel like she needs to see this type of Dr and says she isn't in any pain. Please call back

## 2021-12-10 ENCOUNTER — Telehealth: Payer: Self-pay

## 2021-12-10 NOTE — Telephone Encounter (Signed)
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 Dr Tiburcio Pea probable wants her to see endo. to discuss going on some type of medicine such as Fosamax for bone loss. I stated if she has anymore questions to let me know.

## 2021-12-29 ENCOUNTER — Ambulatory Visit: Payer: Medicare Other | Admitting: Internal Medicine

## 2022-01-28 ENCOUNTER — Telehealth: Payer: Self-pay | Admitting: Family Medicine

## 2022-01-28 NOTE — Telephone Encounter (Signed)
Copied from [place] [phone]. Topic: General - Other >> Jan 28, 2022  2:35 PM Chapman Fitch wrote: Reason for CRM: pt has an appt on Nov 6 / pt would like to ask Baxter Flattery which other vaccines she needs / pt may want to get them at her local pharmacy or should she wait until her appt /please advise

## 2022-02-03 ENCOUNTER — Telehealth: Payer: Self-pay | Admitting: Family Medicine

## 2022-02-03 NOTE — Telephone Encounter (Signed)
Copied from [place] [phone]. Topic: Medicare AWV >> Feb 03, 2022 10:28 AM Jae Dire wrote: Reason for CRM: Left message for patient to call back and schedule Medicare Annual Wellness Visit (AWV) in office.   If unable to come into the office for AWV,  please offer to do virtually or by telephone.  No hx of AWV eligible for AWVI per [employer] as of 01/17/2021  Please schedule at any time with [facility] Health Advisor.      45 minute appointment   Any questions, please call me at [phone]

## 2022-02-22 ENCOUNTER — Ambulatory Visit: Payer: Medicare Other | Admitting: Family Medicine

## 2022-02-25 ENCOUNTER — Ambulatory Visit: Payer: Medicare Other | Admitting: Family Medicine

## 2022-03-08 ENCOUNTER — Ambulatory Visit: Payer: Medicare Other | Admitting: Family Medicine

## 2022-03-08 ENCOUNTER — Encounter: Payer: Self-pay | Admitting: Family Medicine

## 2022-03-08 VITALS — BP 122/76 | HR 60 | Ht 66.0 in | Wt 153.0 lb

## 2022-03-08 DIAGNOSIS — I10 Essential (primary) hypertension: Secondary | ICD-10-CM | POA: Diagnosis not present

## 2022-03-08 DIAGNOSIS — E782 Mixed hyperlipidemia: Secondary | ICD-10-CM | POA: Diagnosis not present

## 2022-03-08 DIAGNOSIS — Z23 Encounter for immunization: Secondary | ICD-10-CM | POA: Diagnosis not present

## 2022-03-08 MED ORDER — LISINOPRIL-HYDROCHLOROTHIAZIDE 20-12.5 MG PO TABS: 1.0000 | ORAL_TABLET | Freq: Every day | ORAL | 1 refills | Status: DC

## 2022-03-08 MED ORDER — ATORVASTATIN CALCIUM 10 MG PO TABS: 10.0000 mg | ORAL_TABLET | Freq: Every day | ORAL | 1 refills | Status: DC

## 2022-03-08 NOTE — Progress Notes (Signed)
Date:  03/08/2022   Name:  Ebony Harris   DOB:  09-Apr-1956   MRN:  [id number]   Chief Complaint: Hyperlipidemia, Hypertension, and Flu Vaccine  Hyperlipidemia This is a chronic problem. The current episode started more than 1 year ago. The problem is controlled. Recent lipid tests were reviewed and are normal. She has no history of chronic renal disease, diabetes, hypothyroidism, liver disease, obesity or nephrotic syndrome. Pertinent negatives include no chest pain, focal sensory loss, focal weakness, leg pain, myalgias or shortness of breath. Current antihyperlipidemic treatment includes statins. The current treatment provides moderate improvement of lipids. There are no compliance problems.  There are no known risk factors for coronary artery disease.  Hypertension This is a chronic problem. The current episode started more than 1 year ago. The problem has been gradually improving since onset. The problem is controlled. Pertinent negatives include no anxiety, blurred vision, chest pain, headaches, malaise/fatigue, neck pain, orthopnea, palpitations, peripheral edema, PND, shortness of breath or sweats. Risk factors for coronary artery disease include dyslipidemia. Past treatments include ACE inhibitors and diuretics. There are no compliance problems.  There is no history of chronic renal disease.    Lab Results  Component Value Date   NA 137 08/24/2021   K 4.5 08/24/2021   CO2 24 08/24/2021   GLUCOSE 105 (H) 08/24/2021   BUN 20 08/24/2021   CREATININE 0.85 08/24/2021   CALCIUM 8.9 08/24/2021   EGFR 76 08/24/2021   GFRNONAA 76 03/03/2020   Lab Results  Component Value Date   CHOL 177 08/24/2021   HDL 59 08/24/2021   LDLCALC 100 (H) 08/24/2021   TRIG 99 08/24/2021   CHOLHDL 4.6 (H) 09/30/2017   No results found for: "TSH" No results found for: "HGBA1C" No results found for: "WBC", "HGB", "HCT", "MCV", "PLT" Lab Results  Component Value Date   ALT 32 02/23/2021   AST 26  02/23/2021   ALKPHOS 94 02/23/2021   BILITOT 0.5 02/23/2021   No results found for: "25OHVITD2", "25OHVITD3", "VD25OH"   Review of Systems  Constitutional:  Negative for malaise/fatigue.  Eyes:  Negative for blurred vision.  Respiratory:  Negative for shortness of breath and wheezing.   Cardiovascular:  Negative for chest pain, palpitations, orthopnea and PND.  Gastrointestinal:  Negative for blood in stool.  Genitourinary:  Negative for difficulty urinating.  Musculoskeletal:  Negative for myalgias and neck pain.  Neurological:  Negative for focal weakness and headaches.    Patient Active Problem List   Diagnosis Date Noted   History of partial hysterectomy 06/12/2020   Mixed hyperlipidemia 09/25/2018   Cyst of breast 24/82/5003   Lichen sclerosus 70/48/8891   Special screening for malignant neoplasms, colon    Rectal polyp    Allergic rhinitis 09/26/2015   Herpes simplex 09/24/2015   Hypertension 11/26/2014    No Known Allergies  Past Surgical History:  Procedure Laterality Date   ABDOMINAL HYSTERECTOMY     partial   BREAST BIOPSY Left    03/2014 negative   BREAST BIOPSY Left    1992 negative   BREAST CYST ASPIRATION Left    negative   COLONOSCOPY WITH PROPOFOL N/A 02/09/2016   Procedure: COLONOSCOPY WITH PROPOFOL;  Surgeon: Lucilla Lame, MD;  Location: [facility];  Service: Endoscopy;  Laterality: N/A;   POLYPECTOMY  02/09/2016   Procedure: POLYPECTOMY;  Surgeon: Lucilla Lame, MD;  Location: [facility];  Service: Endoscopy;;    Social History   Tobacco Use   Smoking  status: Former    Types: Cigarettes    Quit date: 04/19/1980    Years since quitting: 41.9   Smokeless tobacco: Never  Vaping Use   Vaping Use: Never used  Substance Use Topics   Alcohol use: Yes    Alcohol/week: 14.0 standard drinks of alcohol    Types: 14 Glasses of wine per week   Drug use: No     Medication list has been reviewed and updated.  No outpatient  medications have been marked as taking for the 03/08/22 encounter (Office Visit) with Juline Patch, MD.       03/08/2022   11:16 AM 09/30/2021   11:24 AM 08/24/2021    8:04 AM 02/23/2021    7:54 AM  GAD 7 : Generalized Anxiety Score  Nervous, Anxious, on Edge 0 0 0 0  Control/stop worrying 0 0 0 0  Worry too much - different things 0 0 0 0  Trouble relaxing 0 0 0 0  Restless 0 0 0 0  Easily annoyed or irritable 0 0 0 0  Afraid - awful might happen 0 0 0 0  Total GAD 7 Score 0 0 0 0  Anxiety Difficulty Not difficult at all Not difficult at all Not difficult at all        03/08/2022   11:16 AM 09/30/2021   11:23 AM 08/24/2021    8:03 AM  Depression screen PHQ 2/9  Decreased Interest 0 0 0  Down, Depressed, Hopeless 0 0 0  PHQ - 2 Score 0 0 0  Altered sleeping 0 0 1  Tired, decreased energy 0 0 0  Change in appetite 0 0 0  Feeling bad or failure about yourself  0 0 0  Trouble concentrating 0 0 0  Moving slowly or fidgety/restless 0 0 0  Suicidal thoughts 0 0 0  PHQ-9 Score 0 0 1  Difficult doing work/chores Not difficult at all Not difficult at all Not difficult at all    BP Readings from Last 3 Encounters:  03/08/22 122/76  09/30/21 130/80  08/24/21 138/84    Physical Exam Vitals and nursing note reviewed. Exam conducted with a chaperone present.  Constitutional:      General: She is not in acute distress.    Appearance: She is not diaphoretic.  HENT:     Head: Normocephalic and atraumatic.     Right Ear: Tympanic membrane, ear canal and external ear normal.     Left Ear: Tympanic membrane, ear canal and external ear normal.     Nose: Nose normal. No congestion or rhinorrhea.     Mouth/Throat:     Mouth: Mucous membranes are moist.     Pharynx: No oropharyngeal exudate or posterior oropharyngeal erythema.  Eyes:     General:        Right eye: No discharge.        Left eye: No discharge.     Extraocular Movements: Extraocular movements intact.      Conjunctiva/sclera: Conjunctivae normal.     Pupils: Pupils are equal, round, and reactive to light.  Neck:     Thyroid: No thyromegaly.     Vascular: No JVD.  Cardiovascular:     Rate and Rhythm: Normal rate and regular rhythm.     Heart sounds: Normal heart sounds. No murmur heard.    No friction rub. No gallop.  Pulmonary:     Effort: Pulmonary effort is normal.     Breath sounds: Normal breath sounds. No  wheezing, rhonchi or rales.  Abdominal:     General: Bowel sounds are normal.     Palpations: Abdomen is soft. There is no mass.     Tenderness: There is no abdominal tenderness. There is no right CVA tenderness, left CVA tenderness or guarding.  Musculoskeletal:        General: No swelling or tenderness. Normal range of motion.     Cervical back: Normal range of motion and neck supple.  Lymphadenopathy:     Cervical: No cervical adenopathy.  Skin:    General: Skin is warm and dry.     Capillary Refill: Capillary refill takes less than 2 seconds.  Neurological:     General: No focal deficit present.     Mental Status: She is alert.     Deep Tendon Reflexes: Reflexes are normal and symmetric.     Wt Readings from Last 3 Encounters:  03/08/22 153 lb (69.4 kg)  09/30/21 152 lb (68.9 kg)  08/24/21 156 lb (70.8 kg)    BP 122/76   Pulse 60   Ht _0  (1.676 m)   Wt 153 lb (69.4 kg)   SpO2 98%   BMI 24.69 kg/m   Assessment and Plan:  1. Mixed hyperlipidemia Chronic.  Controlled.  Stable.  Continue atorvastatin 10 mg once a day. - atorvastatin (LIPITOR) 10 MG tablet; Take 1 tablet (10 mg total) by mouth daily.  Dispense: 90 tablet; Refill: 1  2. Essential hypertension Chronic.  Controlled.  Stable.  Blood pressure 122/76.  Continue lisinopril hydrochlorothiazide 20-12.5 mg once a day.  Will check CMP for electrolytes and GFR. - lisinopril-hydrochlorothiazide (ZESTORETIC) 20-12.5 MG tablet; Take 1 tablet by mouth daily.  Dispense: 90 tablet; Refill: 1 - Comprehensive  Metabolic Panel (CMET)    3. Need for immunization against influenza Discussed and administered. - Flu Vaccine QUAD High Dose(Fluad)    Otilio Miu, MD

## 2022-03-09 LAB — COMPREHENSIVE METABOLIC PANEL
ALT: 31 IU/L (ref 0–32)
AST: 29 IU/L (ref 0–40)
Albumin/Globulin Ratio: 1.7 (ref 1.2–2.2)
Albumin: 4.5 g/dL (ref 3.9–4.9)
Alkaline Phosphatase: 88 IU/L (ref 44–121)
BUN/Creatinine Ratio: 23 (ref 12–28)
BUN: 18 mg/dL (ref 8–27)
Bilirubin Total: 0.6 mg/dL (ref 0.0–1.2)
CO2: 26 mmol/L (ref 20–29)
Calcium: 9.4 mg/dL (ref 8.7–10.3)
Chloride: 97 mmol/L (ref 96–106)
Creatinine, Ser: 0.8 mg/dL (ref 0.57–1.00)
Globulin, Total: 2.6 g/dL (ref 1.5–4.5)
Glucose: 85 mg/dL (ref 70–99)
Potassium: 3.7 mmol/L (ref 3.5–5.2)
Sodium: 136 mmol/L (ref 134–144)
Total Protein: 7.1 g/dL (ref 6.0–8.5)
eGFR: 81 mL/min/{1.73_m2} (ref >59)

## 2022-04-13 ENCOUNTER — Other Ambulatory Visit: Payer: Self-pay | Admitting: Family Medicine

## 2022-04-13 DIAGNOSIS — Z1231 Encounter for screening mammogram for malignant neoplasm of breast: Secondary | ICD-10-CM

## 2022-04-22 ENCOUNTER — Ambulatory Visit (INDEPENDENT_AMBULATORY_CARE_PROVIDER_SITE_OTHER): Payer: Medicare Other

## 2022-04-22 DIAGNOSIS — Z23 Encounter for immunization: Secondary | ICD-10-CM | POA: Diagnosis not present

## 2022-05-10 ENCOUNTER — Ambulatory Visit
Admission: RE | Admit: 2022-05-10 | Discharge: 2022-05-10 | Disposition: A | Discharge status: Home / Self Care | Payer: Medicare Other | Source: Ambulatory Visit | Attending: Family Medicine | Admitting: Family Medicine

## 2022-05-10 DIAGNOSIS — Z1231 Encounter for screening mammogram for malignant neoplasm of breast: Secondary | ICD-10-CM | POA: Diagnosis not present

## 2022-05-17 ENCOUNTER — Telehealth: Payer: Self-pay | Admitting: Family Medicine

## 2022-05-17 NOTE — Telephone Encounter (Signed)
Copied from [facility] [id number]. Topic: Medicare AWV >> May 17, 2022  1:41 PM Devoria Glassing wrote: Reason for CRM: Left message tor patient to schedule Medicare Annual Wellness Visit (AWV) with [place], [place]  Appointment can be an offiice/telephone or virtual visit;  Please call [phone] ask for Juliann Pulse.

## 2022-05-19 ENCOUNTER — Ambulatory Visit: Payer: Medicare Other | Admitting: Family Medicine

## 2022-05-19 ENCOUNTER — Other Ambulatory Visit
Admission: RE | Admit: 2022-05-19 | Discharge: 2022-05-19 | Disposition: A | Discharge status: Home / Self Care | Payer: Medicare Other | Attending: Family Medicine | Admitting: Family Medicine

## 2022-05-19 ENCOUNTER — Encounter: Payer: Self-pay | Admitting: Family Medicine

## 2022-05-19 VITALS — BP 128/78 | HR 78 | Ht 66.0 in | Wt 153.0 lb

## 2022-05-19 DIAGNOSIS — N200 Calculus of kidney: Secondary | ICD-10-CM

## 2022-05-19 DIAGNOSIS — R1031 Right lower quadrant pain: Secondary | ICD-10-CM | POA: Diagnosis not present

## 2022-05-19 DIAGNOSIS — N3090 Cystitis, unspecified without hematuria: Secondary | ICD-10-CM

## 2022-05-19 DIAGNOSIS — M549 Dorsalgia, unspecified: Secondary | ICD-10-CM | POA: Diagnosis present

## 2022-05-19 DIAGNOSIS — R350 Frequency of micturition: Secondary | ICD-10-CM | POA: Diagnosis not present

## 2022-05-19 DIAGNOSIS — R319 Hematuria, unspecified: Secondary | ICD-10-CM | POA: Diagnosis not present

## 2022-05-19 LAB — POCT URINALYSIS DIPSTICK
Bilirubin, UA: NEGATIVE
Glucose, UA: NEGATIVE
Ketones, UA: NEGATIVE
Nitrite, UA: NEGATIVE
Protein, UA: NEGATIVE
Spec Grav, UA: 1.02 (ref 1.010–1.025)
Urobilinogen, UA: 0.2 E.U./dL
pH, UA: 7 (ref 5.0–8.0)

## 2022-05-19 LAB — CBC WITH DIFFERENTIAL/PLATELET
Abs Immature Granulocytes: 0.03 10*3/uL (ref 0.00–0.07)
Basophils Absolute: 0 10*3/uL (ref 0.0–0.1)
Basophils Relative: 0 %
Eosinophils Absolute: 0.1 10*3/uL (ref 0.0–0.5)
Eosinophils Relative: 1 %
HCT: 41.5 % (ref 36.0–46.0)
Hemoglobin: 14.3 g/dL (ref 12.0–15.0)
Immature Granulocytes: 0 %
Lymphocytes Relative: 14 %
Lymphs Abs: 1.1 10*3/uL (ref 0.7–4.0)
MCH: 31.8 pg (ref 26.0–34.0)
MCHC: 34.5 g/dL (ref 30.0–36.0)
MCV: 92.4 fL (ref 80.0–100.0)
Monocytes Absolute: 1 10*3/uL (ref 0.1–1.0)
Monocytes Relative: 13 %
Neutro Abs: 5.4 10*3/uL (ref 1.7–7.7)
Neutrophils Relative %: 72 %
Platelets: 226 10*3/uL (ref 150–400)
RBC: 4.49 MIL/uL (ref 3.87–5.11)
RDW: 11.9 % (ref 11.5–15.5)
WBC: 7.6 10*3/uL (ref 4.0–10.5)
nRBC: 0 % (ref 0.0–0.2)

## 2022-05-19 MED ORDER — TAMSULOSIN HCL 0.4 MG PO CAPS: 0.4000 mg | ORAL_CAPSULE | Freq: Every day | ORAL | 0 refills | Status: DC

## 2022-05-19 MED ORDER — CEPHALEXIN 500 MG PO CAPS: 500.0000 mg | ORAL_CAPSULE | Freq: Three times a day (TID) | ORAL | 0 refills | Status: DC

## 2022-05-19 MED ORDER — TRAMADOL HCL 50 MG PO TABS: 50.0000 mg | ORAL_TABLET | Freq: Three times a day (TID) | ORAL | 0 refills | Status: AC | PRN

## 2022-05-19 NOTE — Progress Notes (Signed)
Date:  05/19/2022   Name:  Ebony Harris   DOB:  October 21, 1955   MRN:  [id number]   Chief Complaint: Back Pain (R) sided lower back pain started on Sunday, fever and vomiting. No history of kidney stones.)  Back Pain This is a new problem. The current episode started in the past 7 days (sunday). The problem occurs intermittently. The problem has been waxing and waning since onset. The quality of the pain is described as shooting and cramping. The pain is at a severity of 10/10. The pain is moderate. The symptoms are aggravated by bending (not affected by posituin). Pertinent negatives include no abdominal pain, bladder incontinence, chest pain, dysuria, fever, headaches, numbness, paresis, weakness or weight loss. Risk factors include history of cancer.    Lab Results  Component Value Date   NA 136 03/08/2022   K 3.7 03/08/2022   CO2 26 03/08/2022   GLUCOSE 85 03/08/2022   BUN 18 03/08/2022   CREATININE 0.80 03/08/2022   CALCIUM 9.4 03/08/2022   EGFR 81 03/08/2022   GFRNONAA 76 03/03/2020   Lab Results  Component Value Date   CHOL 177 08/24/2021   HDL 59 08/24/2021   LDLCALC 100 (H) 08/24/2021   TRIG 99 08/24/2021   CHOLHDL 4.6 (H) 09/30/2017   No results found for: "TSH" No results found for: "HGBA1C" No results found for: "WBC", "HGB", "HCT", "MCV", "PLT" Lab Results  Component Value Date   ALT 31 03/08/2022   AST 29 03/08/2022   ALKPHOS 88 03/08/2022   BILITOT 0.6 03/08/2022   No results found for: "25OHVITD2", "25OHVITD3", "VD25OH"   Review of Systems  Constitutional:  Negative for chills, fever and weight loss.  HENT:  Negative for drooling, ear discharge, ear pain and sore throat.   Respiratory:  Negative for cough, shortness of breath and wheezing.   Cardiovascular:  Negative for chest pain, palpitations and leg swelling.  Gastrointestinal:  Positive for nausea. Negative for abdominal pain, blood in stool, constipation and diarrhea.  Endocrine: Negative for  polydipsia.  Genitourinary:  Negative for bladder incontinence, dysuria, frequency, hematuria and urgency.  Musculoskeletal:  Positive for back pain. Negative for myalgias and neck pain.  Skin:  Negative for rash.  Allergic/Immunologic: Negative for environmental allergies.  Neurological:  Negative for dizziness, weakness, numbness and headaches.  Hematological:  Does not bruise/bleed easily.  Psychiatric/Behavioral:  Negative for suicidal ideas. The patient is not nervous/anxious.     Patient Active Problem List   Diagnosis Date Noted   History of partial hysterectomy 06/12/2020   Mixed hyperlipidemia 09/25/2018   Cyst of breast 66/09/3014   Lichen sclerosus 04/27/3233   Special screening for malignant neoplasms, colon    Rectal polyp    Allergic rhinitis 09/26/2015   Herpes simplex 09/24/2015   Hypertension 11/26/2014    No Known Allergies  Past Surgical History:  Procedure Laterality Date   ABDOMINAL HYSTERECTOMY     partial   BREAST BIOPSY Left    03/2014 negative   BREAST BIOPSY Left    1992 negative   BREAST CYST ASPIRATION Left    negative   BREAST EXCISIONAL BIOPSY Left    COLONOSCOPY WITH PROPOFOL N/A 02/09/2016   Procedure: COLONOSCOPY WITH PROPOFOL;  Surgeon: Lucilla Lame, MD;  Location: [facility];  Service: Endoscopy;  Laterality: N/A;   POLYPECTOMY  02/09/2016   Procedure: POLYPECTOMY;  Surgeon: Lucilla Lame, MD;  Location: [facility];  Service: Endoscopy;;    Social History   Tobacco  Use   Smoking status: Former    Types: Cigarettes    Quit date: 04/19/1980    Years since quitting: 42.1   Smokeless tobacco: Never  Vaping Use   Vaping Use: Never used  Substance Use Topics   Alcohol use: Yes    Alcohol/week: 14.0 standard drinks of alcohol    Types: 14 Glasses of wine per week   Drug use: No     Medication list has been reviewed and updated.  Current Meds  Medication Sig   atorvastatin (LIPITOR) 10 MG tablet Take 1 tablet (10  mg total) by mouth daily.   Calcium Carb-Cholecalciferol (CALCIUM 1000 + D PO) Take 1 tablet by mouth daily.   Clobetasol Propionate 0.05 % lotion Apply to affected areas daily for one week when having symptoms.   lisinopril-hydrochlorothiazide (ZESTORETIC) 20-12.5 MG tablet Take 1 tablet by mouth daily.   Multiple Vitamins-Minerals (MULTIVITAMIN WITH MINERALS) tablet Take 1 tablet by mouth daily.       05/19/2022   11:34 AM 03/08/2022   11:16 AM 09/30/2021   11:24 AM 08/24/2021    8:04 AM  GAD 7 : Generalized Anxiety Score  Nervous, Anxious, on Edge 0 0 0 0  Control/stop worrying 0 0 0 0  Worry too much - different things 0 0 0 0  Trouble relaxing 0 0 0 0  Restless 0 0 0 0  Easily annoyed or irritable 0 0 0 0  Afraid - awful might happen 0 0 0 0  Total GAD 7 Score 0 0 0 0  Anxiety Difficulty Not difficult at all Not difficult at all Not difficult at all Not difficult at all       05/19/2022   11:34 AM 03/08/2022   11:16 AM 09/30/2021   11:23 AM  Depression screen PHQ 2/9  Decreased Interest 0 0 0  Down, Depressed, Hopeless 0 0 0  PHQ - 2 Score 0 0 0  Altered sleeping 0 0 0  Tired, decreased energy 0 0 0  Change in appetite 0 0 0  Feeling bad or failure about yourself  0 0 0  Trouble concentrating 0 0 0  Moving slowly or fidgety/restless 0 0 0  Suicidal thoughts 0 0 0  PHQ-9 Score 0 0 0  Difficult doing work/chores Not difficult at all Not difficult at all Not difficult at all    BP Readings from Last 3 Encounters:  05/19/22 128/78  03/08/22 122/76  09/30/21 130/80    Physical Exam Vitals and nursing note reviewed.  HENT:     Head: Normocephalic.     Right Ear: Tympanic membrane normal.     Left Ear: Tympanic membrane normal.     Nose: No congestion or rhinorrhea.     Mouth/Throat:     Pharynx: No oropharyngeal exudate or posterior oropharyngeal erythema.  Eyes:     Pupils: Pupils are equal, round, and reactive to light.  Cardiovascular:     Heart sounds: No  murmur heard.    No friction rub. No gallop.  Pulmonary:     Breath sounds: No wheezing, rhonchi or rales.  Abdominal:     General: Bowel sounds are normal.     Tenderness: There is no abdominal tenderness. There is no right CVA tenderness, left CVA tenderness, guarding or rebound.  Neurological:     Mental Status: She is alert.     Wt Readings from Last 3 Encounters:  05/19/22 153 lb (69.4 kg)  03/08/22 153 lb (69.4 kg)  09/30/21 152 lb (68.9 kg)    BP 128/78   Pulse 78   Ht 5\' 6"  (1.676 m)   Wt 153 lb (69.4 kg)   SpO2 98%   BMI 24.69 kg/m   Assessment and Plan:  1. Urine frequency New onset.  Persistent.  Onset on Sunday and this is Wednesday and there is pain that is in the right CVA.  It is radiating to the right lower quadrant.  It is associated with nausea but is currently stable from the pain is to a 10/10.  Urinalysis was done and it is noted to have hematuria with some leukocytes.  We will start on cephalexin 500 mg 3 times a day for 5 days to cover for any urinary tract infection as well as tamsulosin - POCT urinalysis dipstick - cephALEXin (KEFLEX) 500 MG capsule; Take 1 capsule (500 mg total) by mouth 3 (three) times daily.  Dispense: 15 capsule; Refill: 0  2. Nephrolith New onset.  Persistent.  We will arrange this CT renal stone study in the meantime treat with tramadol and Tylenol for pain as well as initiating tamsulosin 0.4 mg once a day. - CT RENAL STONE STUDY; Future - traMADol (ULTRAM) 50 MG tablet; Take 1 tablet (50 mg total) by mouth every 8 (eight) hours as needed for up to 5 days.  Dispense: 15 tablet; Refill: 0  3. Cystitis See above - cephALEXin (KEFLEX) 500 MG capsule; Take 1 capsule (500 mg total) by mouth 3 (three) times daily.  Dispense: 15 capsule; Refill: 0  4. Right lower quadrant abdominal pain See above - CT RENAL STONE STUDY; Future    Otilio Miu, MD

## 2022-05-25 ENCOUNTER — Ambulatory Visit
Admission: RE | Admit: 2022-05-25 | Discharge: 2022-05-25 | Disposition: A | Discharge status: Home / Self Care | Payer: Medicare Other | Source: Ambulatory Visit | Attending: Family Medicine | Admitting: Family Medicine

## 2022-05-25 DIAGNOSIS — N200 Calculus of kidney: Secondary | ICD-10-CM | POA: Diagnosis present

## 2022-05-25 DIAGNOSIS — R1031 Right lower quadrant pain: Secondary | ICD-10-CM | POA: Diagnosis present

## 2022-06-10 ENCOUNTER — Telehealth: Payer: Self-pay | Admitting: Family Medicine

## 2022-06-10 NOTE — Telephone Encounter (Signed)
Copied from [facility] [phone]. Topic: Medicare AWV >> Jun 10, 2022  1:31 PM Devoria Glassing wrote: Reason for CRM: Called patient to schedule Medicare Annual Wellness Visit (AWV). Left message for patient to call back and schedule Medicare Annual Wellness Visit (AWV).  Last date of AWV: NONE  Please schedule an appointment at any time with Kirke Shaggy, [facility]    If any questions, please contact me.  Thank you ,  Sherol Dade; [place] Direct Dial: [phone]

## 2022-08-23 ENCOUNTER — Ambulatory Visit: Payer: Medicare Other | Admitting: Family Medicine

## 2022-08-23 ENCOUNTER — Encounter: Payer: Self-pay | Admitting: Family Medicine

## 2022-08-23 VITALS — BP 128/76 | HR 56 | Ht 66.0 in | Wt 155.0 lb

## 2022-08-23 DIAGNOSIS — E782 Mixed hyperlipidemia: Secondary | ICD-10-CM

## 2022-08-23 DIAGNOSIS — I10 Essential (primary) hypertension: Secondary | ICD-10-CM | POA: Diagnosis not present

## 2022-08-23 MED ORDER — ATORVASTATIN CALCIUM 10 MG PO TABS: 10.0000 mg | ORAL_TABLET | Freq: Every day | ORAL | 1 refills | Status: DC

## 2022-08-23 MED ORDER — LISINOPRIL-HYDROCHLOROTHIAZIDE 20-12.5 MG PO TABS: 1.0000 | ORAL_TABLET | Freq: Every day | ORAL | 1 refills | Status: DC

## 2022-08-23 NOTE — Progress Notes (Signed)
Date:  08/23/2022   Name:  Ebony Harris   DOB:  [birth year]   MRN:  [id number]   Chief Complaint: Hypertension and Hyperlipidemia  Hypertension This is a chronic problem. The current episode started more than 1 year ago. The problem has been gradually improving since onset. The problem is controlled. Pertinent negatives include no chest pain, headaches, orthopnea, palpitations, PND or shortness of breath. There are no associated agents to hypertension. Risk factors for coronary artery disease include dyslipidemia. Past treatments include ACE inhibitors and diuretics. The current treatment provides moderate improvement. There are no compliance problems.  There is no history of angina, kidney disease, CAD/MI, CVA, heart failure, left ventricular hypertrophy, PVD or retinopathy. There is no history of chronic renal disease, a hypertension causing med or renovascular disease.  Hyperlipidemia This is a chronic problem. The current episode started more than 1 year ago. The problem is controlled. Recent lipid tests were reviewed and are normal. She has no history of chronic renal disease, diabetes, hypothyroidism or obesity. Pertinent negatives include no chest pain or shortness of breath. Current antihyperlipidemic treatment includes statins. The current treatment provides moderate improvement of lipids. There are no compliance problems.  Risk factors for coronary artery disease include dyslipidemia and hypertension.    Lab Results  Component Value Date   NA 136 03/08/2022   K 3.7 03/08/2022   CO2 26 03/08/2022   GLUCOSE 85 03/08/2022   BUN 18 03/08/2022   CREATININE 0.80 03/08/2022   CALCIUM 9.4 03/08/2022   EGFR 81 03/08/2022   GFRNONAA 76 03/03/2020   Lab Results  Component Value Date   CHOL 177 08/24/2021   HDL 59 08/24/2021   LDLCALC 100 (H) 08/24/2021   TRIG 99 08/24/2021   CHOLHDL 4.6 (H) 09/30/2017   No results found for: "TSH" No results found for: "HGBA1C" Lab Results   Component Value Date   WBC 7.6 05/19/2022   HGB 14.3 05/19/2022   HCT 41.5 05/19/2022   MCV 92.4 05/19/2022   PLT 226 05/19/2022   Lab Results  Component Value Date   ALT 31 03/08/2022   AST 29 03/08/2022   ALKPHOS 88 03/08/2022   BILITOT 0.6 03/08/2022   No results found for: "25OHVITD2", "25OHVITD3", "VD25OH"   Review of Systems  Constitutional:  Negative for unexpected weight change.  HENT:  Positive for sore throat. Negative for mouth sores, nosebleeds, postnasal drip, rhinorrhea and trouble swallowing.   Respiratory:  Negative for cough, choking, shortness of breath and wheezing.   Cardiovascular:  Negative for chest pain, palpitations, orthopnea and PND.  Gastrointestinal:  Negative for abdominal pain, blood in stool, constipation and diarrhea.  Endocrine: Negative for polydipsia and polyuria.  Neurological:  Negative for headaches.    Patient Active Problem List   Diagnosis Date Noted   History of partial hysterectomy 06/12/2020   Mixed hyperlipidemia 09/25/2018   Cyst of breast 08/30/2018   Lichen sclerosus 01/11/2018   Special screening for malignant neoplasms, colon    Rectal polyp    Allergic rhinitis 09/26/2015   Herpes simplex 09/24/2015   Hypertension 11/26/2014    No Known Allergies  Past Surgical History:  Procedure Laterality Date   ABDOMINAL HYSTERECTOMY     partial   BREAST BIOPSY Left    03/2014 negative   BREAST BIOPSY Left    1992 negative   BREAST CYST ASPIRATION Left    negative   BREAST EXCISIONAL BIOPSY Left    COLONOSCOPY WITH PROPOFOL N/A 02/09/2016  Procedure: COLONOSCOPY WITH PROPOFOL;  Surgeon: Midge Minium, MD;  Location: [facility];  Service: Endoscopy;  Laterality: N/A;   POLYPECTOMY  02/09/2016   Procedure: POLYPECTOMY;  Surgeon: Midge Minium, MD;  Location: [facility];  Service: Endoscopy;;    Social History   Tobacco Use   Smoking status: Former    Types: Cigarettes    Quit date: 04/19/1980    Years  since quitting: 42.3   Smokeless tobacco: Never  Vaping Use   Vaping Use: Never used  Substance Use Topics   Alcohol use: Yes    Alcohol/week: 14.0 standard drinks of alcohol    Types: 14 Glasses of wine per week   Drug use: No     Medication list has been reviewed and updated.  Current Meds  Medication Sig   atorvastatin (LIPITOR) 10 MG tablet Take 1 tablet (10 mg total) by mouth daily.   Calcium Carb-Cholecalciferol (CALCIUM 1000 + D PO) Take 1 tablet by mouth daily.   Clobetasol Propionate 0.05 % lotion Apply to affected areas daily for one week when having symptoms.   lisinopril-hydrochlorothiazide (ZESTORETIC) 20-12.5 MG tablet Take 1 tablet by mouth daily.   Multiple Vitamins-Minerals (MULTIVITAMIN WITH MINERALS) tablet Take 1 tablet by mouth daily.   [DISCONTINUED] cephALEXin (KEFLEX) 500 MG capsule Take 1 capsule (500 mg total) by mouth 3 (three) times daily.       08/23/2022    1:53 PM 05/19/2022   11:34 AM 03/08/2022   11:16 AM 09/30/2021   11:24 AM  GAD 7 : Generalized Anxiety Score  Nervous, Anxious, on Edge 0 0 0 0  Control/stop worrying 0 0 0 0  Worry too much - different things 0 0 0 0  Trouble relaxing 0 0 0 0  Restless 0 0 0 0  Easily annoyed or irritable 0 0 0 0  Afraid - awful might happen 0 0 0 0  Total GAD 7 Score 0 0 0 0  Anxiety Difficulty Not difficult at all Not difficult at all Not difficult at all Not difficult at all       08/23/2022    1:53 PM 05/19/2022   11:34 AM 03/08/2022   11:16 AM  Depression screen PHQ 2/9  Decreased Interest 0 0 0  Down, Depressed, Hopeless 0 0 0  PHQ - 2 Score 0 0 0  Altered sleeping 0 0 0  Tired, decreased energy 0 0 0  Change in appetite 0 0 0  Feeling bad or failure about yourself  0 0 0  Trouble concentrating 0 0 0  Moving slowly or fidgety/restless 0 0 0  Suicidal thoughts 0 0 0  PHQ-9 Score 0 0 0  Difficult doing work/chores Not difficult at all Not difficult at all Not difficult at all    BP Readings  from Last 3 Encounters:  08/23/22 128/76  05/19/22 128/78  03/08/22 122/76    Physical Exam Vitals and nursing note reviewed. Exam conducted with a chaperone present.  Constitutional:      General: She is not in acute distress.    Appearance: She is not diaphoretic.  HENT:     Head: Normocephalic and atraumatic.     Right Ear: Tympanic membrane and external ear normal. There is no impacted cerumen.     Left Ear: Tympanic membrane and external ear normal. There is no impacted cerumen.     Nose: Nose normal. No congestion or rhinorrhea.     Mouth/Throat:     Mouth: Mucous  membranes are moist.     Pharynx: No oropharyngeal exudate or posterior oropharyngeal erythema.  Eyes:     General:        Right eye: No discharge.        Left eye: No discharge.     Conjunctiva/sclera: Conjunctivae normal.     Pupils: Pupils are equal, round, and reactive to light.  Neck:     Thyroid: No thyromegaly.     Vascular: No JVD.  Cardiovascular:     Rate and Rhythm: Normal rate and regular rhythm.     Heart sounds: Normal heart sounds. No murmur heard.    No friction rub. No gallop.  Pulmonary:     Effort: Pulmonary effort is normal.     Breath sounds: Normal breath sounds. No wheezing, rhonchi or rales.  Chest:     Chest wall: No tenderness.  Abdominal:     General: Bowel sounds are normal.     Palpations: Abdomen is soft. There is no mass.     Tenderness: There is no abdominal tenderness. There is no guarding or rebound.  Musculoskeletal:        General: Normal range of motion.     Cervical back: Normal range of motion and neck supple.  Lymphadenopathy:     Cervical: No cervical adenopathy.  Skin:    General: Skin is warm and dry.     Findings: No erythema.  Neurological:     Mental Status: She is alert.     Deep Tendon Reflexes: Reflexes are normal and symmetric.     Wt Readings from Last 3 Encounters:  08/23/22 155 lb (70.3 kg)  05/19/22 153 lb (69.4 kg)  03/08/22 153 lb (69.4  kg)    BP 128/76   Pulse (!) 56   Ht 5\' 6"  (1.676 m)   Wt 155 lb (70.3 kg)   SpO2 97%   BMI 25.02 kg/m   Assessment and Plan: 1. Essential hypertension Chronic.  Controlled.  Stable.  Blood pressure 128/76.  Asymptomatic.  Tolerating medications well.  Continue lisinopril hydrochlorothiazide 20-12.5 mg once a day.  Will obtain renal function panel for GFR and electrolytes.  Will recheck patient in 6 months. - lisinopril-hydrochlorothiazide (ZESTORETIC) 20-12.5 MG tablet; Take 1 tablet by mouth daily.  Dispense: 90 tablet; Refill: 1 - Renal Function Panel  2. Mixed hyperlipidemia Chronic.  Controlled.  Stable.  Continue atorvastatin 10 mg once a day.  Will check lipid panel for current status of LDL. - atorvastatin (LIPITOR) 10 MG tablet; Take 1 tablet (10 mg total) by mouth daily.  Dispense: 90 tablet; Refill: 1 - Lipid Panel With LDL/HDL Ratio     Elizabeth Sauer, MD

## 2022-08-24 LAB — RENAL FUNCTION PANEL
Albumin: 4.6 g/dL (ref 3.9–4.9)
BUN/Creatinine Ratio: 24 (ref 12–28)
BUN: 21 mg/dL (ref 8–27)
CO2: 24 mmol/L (ref 20–29)
Calcium: 9.4 mg/dL (ref 8.7–10.3)
Chloride: 101 mmol/L (ref 96–106)
Creatinine, Ser: 0.87 mg/dL (ref 0.57–1.00)
Glucose: 93 mg/dL (ref 70–99)
Phosphorus: 4.1 mg/dL (ref 3.0–4.3)
Potassium: 4 mmol/L (ref 3.5–5.2)
Sodium: 142 mmol/L (ref 134–144)
eGFR: 73 mL/min/{1.73_m2} (ref >59)

## 2022-08-24 LAB — LIPID PANEL WITH LDL/HDL RATIO
Cholesterol, Total: 212 mg/dL — ABNORMAL HIGH (ref 100–199)
HDL: 63 mg/dL (ref >39)
LDL Chol Calc (NIH): 119 mg/dL — ABNORMAL HIGH (ref 0–99)
LDL/HDL Ratio: 1.9 ratio (ref 0.0–3.2)
Triglycerides: 170 mg/dL — ABNORMAL HIGH (ref 0–149)
VLDL Cholesterol Cal: 30 mg/dL (ref 5–40)

## 2022-09-26 ENCOUNTER — Other Ambulatory Visit: Payer: Self-pay | Admitting: Family Medicine

## 2022-09-26 DIAGNOSIS — I10 Essential (primary) hypertension: Secondary | ICD-10-CM

## 2022-09-27 NOTE — Telephone Encounter (Signed)
Resending since last was sent as "no print"  Requested Prescriptions  Pending Prescriptions Disp Refills   lisinopril-hydrochlorothiazide (ZESTORETIC) 20-12.5 MG tablet [Pharmacy Med Name: LISINOPRIL-HCTZ 20-12.5 MG TAB] 90 tablet 1    Sig: TAKE 1 TABLET BY MOUTH EVERY DAY     Cardiovascular:  ACEI + Diuretic Combos Passed - 09/26/2022  9:43 AM      Passed - Na in normal range and within 180 days    Sodium  Date Value Ref Range Status  08/23/2022 142 134 - 144 mmol/L Final         Passed - K in normal range and within 180 days    Potassium  Date Value Ref Range Status  08/23/2022 4.0 3.5 - 5.2 mmol/L Final         Passed - Cr in normal range and within 180 days    Creatinine, Ser  Date Value Ref Range Status  08/23/2022 0.87 0.57 - 1.00 mg/dL Final         Passed - eGFR is 30 or above and within 180 days    GFR calc Af Amer  Date Value Ref Range Status  03/03/2020 88 >59 mL/min/1.73 Final    Comment:    **In accordance with recommendations from the NKF-ASN Task force,**   [facility] is in the process of updating its eGFR calculation to the   2021 CKD-EPI creatinine equation that estimates kidney function   without a race variable.    GFR calc non Af Amer  Date Value Ref Range Status  03/03/2020 76 >59 mL/min/1.73 Final   eGFR  Date Value Ref Range Status  08/23/2022 73 >59 mL/min/1.73 Final         Passed - Patient is not pregnant      Passed - Last BP in normal range    BP Readings from Last 1 Encounters:  08/23/22 128/76         Passed - Valid encounter within last 6 months    Recent Outpatient Visits           1 month ago Essential hypertension   [facility] [facility]   4 months ago Urine frequency   [place] [facility]   6 months ago Pneumococcal vaccination administered at current visit   [facility]   12 months ago Mastitis   [facility]   1 year ago Essential hypertension   [place] [facility]       Future Appointments             In 5 months Duanne Limerick, MD [facility] at [facility], [facility]

## 2022-10-18 ENCOUNTER — Other Ambulatory Visit: Payer: Self-pay | Admitting: Family Medicine

## 2022-10-18 DIAGNOSIS — E782 Mixed hyperlipidemia: Secondary | ICD-10-CM

## 2023-01-27 ENCOUNTER — Telehealth: Payer: Self-pay | Admitting: Family Medicine

## 2023-01-27 NOTE — Telephone Encounter (Signed)
Copied from CRM [id number]. Topic: Medicare AWV >> Jan 27, 2023  9:46 AM Payton Doughty wrote: Reason for CRM: Called LVM 01/27/2023 to schedule Annual Wellness Visit  Verlee Rossetti; Care Guide Ambulatory Clinical Support [facility] Direct Dial: [phone]

## 2023-02-24 ENCOUNTER — Ambulatory Visit: Payer: Medicare PPO | Admitting: Family Medicine

## 2023-02-24 ENCOUNTER — Encounter: Payer: Self-pay | Admitting: Family Medicine

## 2023-02-24 VITALS — BP 126/74 | HR 68 | Ht 66.0 in | Wt 156.0 lb

## 2023-02-24 DIAGNOSIS — I10 Essential (primary) hypertension: Secondary | ICD-10-CM

## 2023-02-24 DIAGNOSIS — E782 Mixed hyperlipidemia: Secondary | ICD-10-CM

## 2023-02-24 MED ORDER — ATORVASTATIN CALCIUM 10 MG PO TABS
10.0000 mg | ORAL_TABLET | Freq: Every day | ORAL | 1 refills | Status: AC
Start: 2023-02-24 — End: ?

## 2023-02-24 MED ORDER — LISINOPRIL-HYDROCHLOROTHIAZIDE 20-12.5 MG PO TABS
1.0000 | ORAL_TABLET | Freq: Every day | ORAL | 1 refills | Status: AC
Start: 2023-02-24 — End: ?

## 2023-02-24 NOTE — Progress Notes (Addendum)
Date:  02/24/2023   Name:  Ebony Harris   DOB:  1955-12-26   MRN:  [id number]   Chief Complaint: Hypertension  Hypertension This is a chronic problem. The current episode started more than 1 year ago. The problem has been gradually improving since onset. The problem is controlled. Pertinent negatives include no anxiety, blurred vision, chest pain, headaches, malaise/fatigue, neck pain, orthopnea, palpitations, peripheral edema, PND, shortness of breath or sweats. There are no associated agents to hypertension. There are no known risk factors for coronary artery disease. Past treatments include ACE inhibitors and diuretics. The current treatment provides moderate improvement. There are no compliance problems.  There is no history of angina, kidney disease, CAD/MI, CVA, heart failure, left ventricular hypertrophy, PVD or retinopathy. There is no history of chronic renal disease, a hypertension causing med or renovascular disease.  Hyperlipidemia This is a chronic problem. The current episode started more than 1 year ago. She has no history of chronic renal disease. Pertinent negatives include no chest pain, myalgias or shortness of breath. Current antihyperlipidemic treatment includes statins. The current treatment provides moderate improvement of lipids. There are no compliance problems.  Risk factors for coronary artery disease include dyslipidemia and hypertension.    Lab Results  Component Value Date   NA 142 08/23/2022   K 4.0 08/23/2022   CO2 24 08/23/2022   GLUCOSE 93 08/23/2022   BUN 21 08/23/2022   CREATININE 0.87 08/23/2022   CALCIUM 9.4 08/23/2022   EGFR 73 08/23/2022   GFRNONAA 76 03/03/2020   Lab Results  Component Value Date   CHOL 212 (H) 08/23/2022   HDL 63 08/23/2022   LDLCALC 119 (H) 08/23/2022   TRIG 170 (H) 08/23/2022   CHOLHDL 4.6 (H) 09/30/2017   No results found for: "TSH" No results found for: "HGBA1C" Lab Results  Component Value Date   WBC 7.6 05/19/2022    HGB 14.3 05/19/2022   HCT 41.5 05/19/2022   MCV 92.4 05/19/2022   PLT 226 05/19/2022   Lab Results  Component Value Date   ALT 31 03/08/2022   AST 29 03/08/2022   ALKPHOS 88 03/08/2022   BILITOT 0.6 03/08/2022   No results found for: "25OHVITD2", "25OHVITD3", "VD25OH"   Review of Systems  Constitutional:  Negative for chills, fever and malaise/fatigue.  HENT:  Negative for drooling, ear discharge, ear pain and sore throat.   Eyes:  Negative for blurred vision.  Respiratory:  Negative for cough, chest tightness, shortness of breath and wheezing.   Cardiovascular:  Negative for chest pain, palpitations, orthopnea, leg swelling and PND.  Gastrointestinal:  Negative for abdominal pain, blood in stool, constipation, diarrhea and nausea.  Endocrine: Negative for polydipsia.  Genitourinary:  Negative for dysuria, frequency, hematuria and urgency.  Musculoskeletal:  Negative for back pain, myalgias and neck pain.  Skin:  Negative for rash.  Allergic/Immunologic: Negative for environmental allergies.  Neurological:  Negative for dizziness and headaches.  Hematological:  Does not bruise/bleed easily.  Psychiatric/Behavioral:  Negative for suicidal ideas. The patient is not nervous/anxious.     Patient Active Problem List   Diagnosis Date Noted   History of partial hysterectomy 06/12/2020   Mixed hyperlipidemia 09/25/2018   Cyst of breast 08/30/2018   Lichen sclerosus 01/11/2018   Special screening for malignant neoplasms, colon    Rectal polyp    Allergic rhinitis 09/26/2015   Herpes simplex 09/24/2015   Hypertension 11/26/2014    No Known Allergies  Past Surgical History:  Procedure Laterality  Date   ABDOMINAL HYSTERECTOMY     partial   BREAST BIOPSY Left    03/2014 negative   BREAST BIOPSY Left    1992 negative   BREAST CYST ASPIRATION Left    negative   BREAST EXCISIONAL BIOPSY Left    COLONOSCOPY WITH PROPOFOL N/A 02/09/2016   Procedure: COLONOSCOPY WITH  PROPOFOL;  Surgeon: Midge Minium, MD;  Location: [facility];  Service: Endoscopy;  Laterality: N/A;   POLYPECTOMY  02/09/2016   Procedure: POLYPECTOMY;  Surgeon: Midge Minium, MD;  Location: [facility];  Service: Endoscopy;;    Social History   Tobacco Use   Smoking status: Former    Current packs/day: 0.00    Types: Cigarettes    Quit date: 04/19/1980    Years since quitting: 42.8   Smokeless tobacco: Never  Vaping Use   Vaping status: Never Used  Substance Use Topics   Alcohol use: Yes    Alcohol/week: 14.0 standard drinks of alcohol    Types: 14 Glasses of wine per week   Drug use: No     Medication list has been reviewed and updated.  Current Meds  Medication Sig   atorvastatin (LIPITOR) 10 MG tablet TAKE 1 TABLET BY MOUTH EVERY DAY   Calcium Carb-Cholecalciferol (CALCIUM 1000 + D PO) Take 1 tablet by mouth daily.   Clobetasol Propionate 0.05 % lotion Apply to affected areas daily for one week when having symptoms.   lisinopril-hydrochlorothiazide (ZESTORETIC) 20-12.5 MG tablet TAKE 1 TABLET BY MOUTH EVERY DAY   Multiple Vitamins-Minerals (MULTIVITAMIN WITH MINERALS) tablet Take 1 tablet by mouth daily.       02/24/2023    8:10 AM 08/23/2022    1:53 PM 05/19/2022   11:34 AM 03/08/2022   11:16 AM  GAD 7 : Generalized Anxiety Score  Nervous, Anxious, on Edge 0 0 0 0  Control/stop worrying 0 0 0 0  Worry too much - different things 0 0 0 0  Trouble relaxing 0 0 0 0  Restless 0 0 0 0  Easily annoyed or irritable 0 0 0 0  Afraid - awful might happen 0 0 0 0  Total GAD 7 Score 0 0 0 0  Anxiety Difficulty Not difficult at all Not difficult at all Not difficult at all Not difficult at all       02/24/2023    8:10 AM 08/23/2022    1:53 PM 05/19/2022   11:34 AM  Depression screen PHQ 2/9  Decreased Interest 0 0 0  Down, Depressed, Hopeless 0 0 0  PHQ - 2 Score 0 0 0  Altered sleeping 0 0 0  Tired, decreased energy 0 0 0  Change in appetite 0 0 0   Feeling bad or failure about yourself  0 0 0  Trouble concentrating 0 0 0  Moving slowly or fidgety/restless 0 0 0  Suicidal thoughts 0 0 0  PHQ-9 Score 0 0 0  Difficult doing work/chores Not difficult at all Not difficult at all Not difficult at all    BP Readings from Last 3 Encounters:  02/24/23 126/74  08/23/22 128/76  05/19/22 128/78    Physical Exam Vitals and nursing note reviewed. Exam conducted with a chaperone present.  Constitutional:      General: She is not in acute distress.    Appearance: She is not diaphoretic.  HENT:     Head: Normocephalic and atraumatic.     Right Ear: Tympanic membrane, ear canal and external ear  normal.     Left Ear: Tympanic membrane, ear canal and external ear normal.     Nose: Nose normal.     Mouth/Throat:     Mouth: Mucous membranes are moist.  Eyes:     General:        Right eye: No discharge.        Left eye: No discharge.     Conjunctiva/sclera: Conjunctivae normal.     Pupils: Pupils are equal, round, and reactive to light.  Neck:     Thyroid: No thyromegaly.     Vascular: No JVD.  Cardiovascular:     Rate and Rhythm: Normal rate and regular rhythm.     Chest Wall: PMI is not displaced. No thrill.     Heart sounds: Normal heart sounds, S1 normal and S2 normal. No murmur heard.    No systolic murmur is present.     No diastolic murmur is present.     No friction rub. No gallop. No S3 or S4 sounds.  Pulmonary:     Effort: Pulmonary effort is normal.     Breath sounds: Normal breath sounds. No decreased breath sounds, wheezing or rhonchi.  Abdominal:     General: Bowel sounds are normal.     Palpations: Abdomen is soft. There is no mass.     Tenderness: There is no abdominal tenderness. There is no guarding.  Musculoskeletal:        General: Normal range of motion.     Cervical back: Normal range of motion and neck supple.     Right lower leg: No edema.     Left lower leg: No edema.  Lymphadenopathy:     Head:      Right side of head: No submandibular or tonsillar adenopathy.     Left side of head: No submandibular or tonsillar adenopathy.     Cervical: No cervical adenopathy.     Right cervical: No superficial, deep or posterior cervical adenopathy.    Left cervical: No superficial, deep or posterior cervical adenopathy.     Upper Body:     Right upper body: No supraclavicular adenopathy.     Left upper body: No supraclavicular adenopathy.  Skin:    General: Skin is warm and dry.  Neurological:     Mental Status: She is alert.     Wt Readings from Last 3 Encounters:  02/24/23 156 lb (70.8 kg)  08/23/22 155 lb (70.3 kg)  05/19/22 153 lb (69.4 kg)    BP 126/74   Pulse 68   Ht 5\' 6"  (1.676 m)   Wt 156 lb (70.8 kg)   SpO2 98%   BMI 25.18 kg/m   Assessment and Plan:  1. Essential hypertension Chronic.  Controlled.  Stable.  Blood pressure today is 126/74.  Asymptomatic.  Tolerating medications well.  Will continue lisinopril hydrochlorothiazide combination 20-12.5 mg once a day.  Will check renal function panel for electrolytes and GFR.  Will recheck patient in 6 months. - lisinopril-hydrochlorothiazide (ZESTORETIC) 20-12.5 MG tablet; Take 1 tablet by mouth daily.  Dispense: 90 tablet; Refill: 1 - Renal Function Panel  2. Mixed hyperlipidemia Chronic.  Controlled.  Stable.  Last measurement of LDL in the mid 120 ranges.  Diet has been reemphasized.  We will recheck patient in 6 months fasting.  We will check lipid panel today and reemphasized lowering her cholesterol combination of medication Lipitor 10 mg once a day of which we will continue at current dosing and low-cholesterol low  triglyceride dietary guidelines.  Will recheck patient in 6 months. - atorvastatin (LIPITOR) 10 MG tablet; Take 1 tablet (10 mg total) by mouth daily.  Dispense: 90 tablet; Refill: 1 - Lipid Panel With LDL/HDL Ratio     Elizabeth Sauer, MD

## 2023-02-24 NOTE — Patient Instructions (Signed)

## 2023-02-25 ENCOUNTER — Encounter: Payer: Self-pay | Admitting: Family Medicine

## 2023-02-25 LAB — LIPID PANEL WITH LDL/HDL RATIO
Cholesterol, Total: 204 mg/dL — ABNORMAL HIGH (ref 100–199)
HDL: 51 mg/dL (ref >39)
LDL Chol Calc (NIH): 125 mg/dL — ABNORMAL HIGH (ref 0–99)
LDL/HDL Ratio: 2.5 ratio (ref 0.0–3.2)
Triglycerides: 160 mg/dL — ABNORMAL HIGH (ref 0–149)
VLDL Cholesterol Cal: 28 mg/dL (ref 5–40)

## 2023-02-25 LAB — RENAL FUNCTION PANEL
Albumin: 4.5 g/dL (ref 3.9–4.9)
BUN/Creatinine Ratio: 22 (ref 12–28)
BUN: 20 mg/dL (ref 8–27)
CO2: 25 mmol/L (ref 20–29)
Calcium: 9.2 mg/dL (ref 8.7–10.3)
Chloride: 100 mmol/L (ref 96–106)
Creatinine, Ser: 0.89 mg/dL (ref 0.57–1.00)
Glucose: 101 mg/dL — ABNORMAL HIGH (ref 70–99)
Phosphorus: 4.2 mg/dL (ref 3.0–4.3)
Potassium: 4.5 mmol/L (ref 3.5–5.2)
Sodium: 139 mmol/L (ref 134–144)
eGFR: 71 mL/min/{1.73_m2} (ref >59)

## 2023-03-22 ENCOUNTER — Other Ambulatory Visit: Payer: Self-pay | Admitting: Internal Medicine

## 2023-03-22 DIAGNOSIS — M810 Age-related osteoporosis without current pathological fracture: Secondary | ICD-10-CM

## 2023-03-22 DIAGNOSIS — Z1231 Encounter for screening mammogram for malignant neoplasm of breast: Secondary | ICD-10-CM

## 2023-03-22 DIAGNOSIS — M8000XA Age-related osteoporosis with current pathological fracture, unspecified site, initial encounter for fracture: Secondary | ICD-10-CM

## 2023-05-09 DIAGNOSIS — M810 Age-related osteoporosis without current pathological fracture: Secondary | ICD-10-CM | POA: Diagnosis not present

## 2023-05-12 ENCOUNTER — Ambulatory Visit
Admission: RE | Admit: 2023-05-12 | Discharge: 2023-05-12 | Disposition: A | Discharge status: Home / Self Care | Payer: Medicare PPO | Source: Ambulatory Visit | Attending: Internal Medicine | Admitting: Internal Medicine

## 2023-05-12 DIAGNOSIS — Z1231 Encounter for screening mammogram for malignant neoplasm of breast: Secondary | ICD-10-CM | POA: Diagnosis not present

## 2023-08-09 ENCOUNTER — Ambulatory Visit: Payer: Self-pay | Admitting: Family Medicine

## 2024-02-22 ENCOUNTER — Other Ambulatory Visit: Payer: Self-pay | Admitting: Internal Medicine

## 2024-02-22 DIAGNOSIS — Z1231 Encounter for screening mammogram for malignant neoplasm of breast: Secondary | ICD-10-CM

## 2024-05-14 ENCOUNTER — Ambulatory Visit

## 2024-06-05 ENCOUNTER — Ambulatory Visit
